# Patient Record
Sex: Female | Born: 1950 | Race: White | Hispanic: No | Smoking: Never smoker
Health system: Southern US, Community
[De-identification: ages and names within clinical notes are randomized; demographics above are authoritative.]

## PROBLEM LIST (undated history)

## (undated) DIAGNOSIS — I10 Essential (primary) hypertension: Secondary | ICD-10-CM

## (undated) DIAGNOSIS — Z86010 Personal history of colonic polyps: Secondary | ICD-10-CM

## (undated) DIAGNOSIS — J329 Chronic sinusitis, unspecified: Secondary | ICD-10-CM

## (undated) DIAGNOSIS — M549 Dorsalgia, unspecified: Secondary | ICD-10-CM

## (undated) DIAGNOSIS — G4730 Sleep apnea, unspecified: Secondary | ICD-10-CM

## (undated) HISTORY — PX: DILATION AND CURETTAGE OF UTERUS: SHX78

## (undated) HISTORY — PX: KNEE ARTHROSCOPY: SUR90

## (undated) HISTORY — PX: ABDOMINAL HYSTERECTOMY: SHX81

## (undated) HISTORY — PX: TONSILLECTOMY: SUR1361

## (undated) HISTORY — PX: ROTATOR CUFF REPAIR: SHX139

## (undated) HISTORY — PX: COLONOSCOPY: SHX174

---
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
## 2006-06-24 DIAGNOSIS — Z860101 Personal history of adenomatous and serrated colon polyps: Secondary | ICD-10-CM | POA: Insufficient documentation

## 2006-06-24 DIAGNOSIS — Z86010 Personal history of colonic polyps: Secondary | ICD-10-CM

## 2006-06-24 HISTORY — DX: Personal history of adenomatous and serrated colon polyps: Z86.0101

## 2006-06-24 HISTORY — PX: BREAST LUMPECTOMY: SHX2

## 2006-06-24 HISTORY — DX: Personal history of colonic polyps: Z86.010

## 2006-07-03 ENCOUNTER — Ambulatory Visit (HOSPITAL_COMMUNITY): Admission: RE | Admit: 2006-07-03 | Discharge: 2006-07-03 | Payer: Self-pay | Admitting: Gastroenterology

## 2006-07-03 ENCOUNTER — Encounter (INDEPENDENT_AMBULATORY_CARE_PROVIDER_SITE_OTHER): Payer: Self-pay | Admitting: Specialist

## 2006-07-24 ENCOUNTER — Emergency Department (HOSPITAL_COMMUNITY): Admission: EM | Admit: 2006-07-24 | Discharge: 2006-07-24 | Payer: Self-pay | Admitting: Emergency Medicine

## 2006-10-02 ENCOUNTER — Inpatient Hospital Stay (HOSPITAL_COMMUNITY): Admission: EM | Admit: 2006-10-02 | Discharge: 2006-10-03 | Payer: Self-pay | Admitting: Gastroenterology

## 2006-10-02 ENCOUNTER — Ambulatory Visit: Payer: Self-pay | Admitting: Internal Medicine

## 2006-10-02 ENCOUNTER — Encounter (INDEPENDENT_AMBULATORY_CARE_PROVIDER_SITE_OTHER): Payer: Self-pay | Admitting: Specialist

## 2006-10-03 ENCOUNTER — Encounter: Payer: Self-pay | Admitting: Internal Medicine

## 2006-10-05 ENCOUNTER — Emergency Department (HOSPITAL_COMMUNITY): Admission: EM | Admit: 2006-10-05 | Discharge: 2006-10-05 | Payer: Self-pay | Admitting: Family Medicine

## 2006-11-19 ENCOUNTER — Encounter: Admission: RE | Admit: 2006-11-19 | Discharge: 2006-11-19 | Payer: Self-pay | Admitting: Family Medicine

## 2006-11-26 ENCOUNTER — Encounter: Admission: RE | Admit: 2006-11-26 | Discharge: 2006-11-26 | Payer: Self-pay | Admitting: Family Medicine

## 2006-12-10 ENCOUNTER — Encounter: Admission: RE | Admit: 2006-12-10 | Discharge: 2006-12-10 | Payer: Self-pay | Admitting: Family Medicine

## 2006-12-10 ENCOUNTER — Encounter (INDEPENDENT_AMBULATORY_CARE_PROVIDER_SITE_OTHER): Payer: Self-pay | Admitting: Diagnostic Radiology

## 2006-12-25 ENCOUNTER — Encounter: Admission: RE | Admit: 2006-12-25 | Discharge: 2006-12-25 | Payer: Self-pay | Admitting: Family Medicine

## 2007-01-13 ENCOUNTER — Ambulatory Visit (HOSPITAL_COMMUNITY): Admission: RE | Admit: 2007-01-13 | Discharge: 2007-01-13 | Payer: Self-pay | Admitting: General Surgery

## 2007-01-13 ENCOUNTER — Encounter: Admission: RE | Admit: 2007-01-13 | Discharge: 2007-01-13 | Payer: Self-pay | Admitting: General Surgery

## 2007-01-13 ENCOUNTER — Encounter (INDEPENDENT_AMBULATORY_CARE_PROVIDER_SITE_OTHER): Payer: Self-pay | Admitting: General Surgery

## 2007-02-09 ENCOUNTER — Ambulatory Visit: Payer: Self-pay | Admitting: Oncology

## 2007-02-17 ENCOUNTER — Ambulatory Visit: Admission: RE | Admit: 2007-02-17 | Discharge: 2007-05-11 | Payer: Self-pay | Admitting: Radiation Oncology

## 2007-03-12 LAB — CBC WITH DIFFERENTIAL/PLATELET
BASO%: 0.7 % (ref 0.0–2.0)
Basophils Absolute: 0 10*3/uL (ref 0.0–0.1)
EOS%: 1.1 % (ref 0.0–7.0)
Eosinophils Absolute: 0 10*3/uL (ref 0.0–0.5)
HCT: 36.7 % (ref 34.8–46.6)
HGB: 12.5 g/dL (ref 11.6–15.9)
LYMPH%: 47.7 % (ref 14.0–48.0)
MCH: 27.6 pg (ref 26.0–34.0)
MCHC: 34.1 g/dL (ref 32.0–36.0)
MCV: 81 fL (ref 81.0–101.0)
MONO#: 0.4 10*3/uL (ref 0.1–0.9)
MONO%: 8.4 % (ref 0.0–13.0)
NEUT#: 1.8 10*3/uL (ref 1.5–6.5)
NEUT%: 42.1 % (ref 39.6–76.8)
Platelets: 175 10*3/uL (ref 145–400)
RBC: 4.53 10*6/uL (ref 3.70–5.32)
RDW: 13 % (ref 11.3–14.5)
WBC: 4.2 10*3/uL (ref 3.9–10.0)
lymph#: 2 10*3/uL (ref 0.9–3.3)

## 2007-03-12 LAB — COMPREHENSIVE METABOLIC PANEL
ALT: 34 U/L (ref 0–35)
AST: 25 U/L (ref 0–37)
Albumin: 4.5 g/dL (ref 3.5–5.2)
Alkaline Phosphatase: 82 U/L (ref 39–117)
BUN: 14 mg/dL (ref 6–23)
CO2: 26 mEq/L (ref 19–32)
Calcium: 9 mg/dL (ref 8.4–10.5)
Chloride: 106 mEq/L (ref 96–112)
Creatinine, Ser: 0.77 mg/dL (ref 0.40–1.20)
Glucose, Bld: 101 mg/dL — ABNORMAL HIGH (ref 70–99)
Potassium: 3.9 mEq/L (ref 3.5–5.3)
Sodium: 141 mEq/L (ref 135–145)
Total Bilirubin: 0.3 mg/dL (ref 0.3–1.2)
Total Protein: 6.9 g/dL (ref 6.0–8.3)

## 2007-04-03 ENCOUNTER — Ambulatory Visit: Payer: Self-pay | Admitting: Oncology

## 2007-05-20 ENCOUNTER — Ambulatory Visit: Admission: RE | Admit: 2007-05-20 | Discharge: 2007-06-04 | Payer: Self-pay | Admitting: Radiation Oncology

## 2007-07-06 ENCOUNTER — Ambulatory Visit: Payer: Self-pay | Admitting: Oncology

## 2007-07-08 LAB — CBC WITH DIFFERENTIAL/PLATELET
BASO%: 0.5 % (ref 0.0–2.0)
Basophils Absolute: 0 10*3/uL (ref 0.0–0.1)
EOS%: 0.9 % (ref 0.0–7.0)
Eosinophils Absolute: 0 10*3/uL (ref 0.0–0.5)
HCT: 35.9 % (ref 34.8–46.6)
HGB: 12.3 g/dL (ref 11.6–15.9)
LYMPH%: 38.9 % (ref 14.0–48.0)
MCH: 27.4 pg (ref 26.0–34.0)
MCHC: 34.2 g/dL (ref 32.0–36.0)
MCV: 80.2 fL — ABNORMAL LOW (ref 81.0–101.0)
MONO#: 0.4 10*3/uL (ref 0.1–0.9)
MONO%: 11.6 % (ref 0.0–13.0)
NEUT#: 1.7 10*3/uL (ref 1.5–6.5)
NEUT%: 48.1 % (ref 39.6–76.8)
Platelets: 175 10*3/uL (ref 145–400)
RBC: 4.47 10*6/uL (ref 3.70–5.32)
RDW: 13.9 % (ref 11.3–14.5)
WBC: 3.5 10*3/uL — ABNORMAL LOW (ref 3.9–10.0)
lymph#: 1.3 10*3/uL (ref 0.9–3.3)

## 2007-07-08 LAB — COMPREHENSIVE METABOLIC PANEL WITH GFR
ALT: 21 U/L (ref 0–35)
AST: 22 U/L (ref 0–37)
Albumin: 4.1 g/dL (ref 3.5–5.2)
Alkaline Phosphatase: 63 U/L (ref 39–117)
BUN: 12 mg/dL (ref 6–23)
CO2: 27 meq/L (ref 19–32)
Calcium: 8.7 mg/dL (ref 8.4–10.5)
Chloride: 108 meq/L (ref 96–112)
Creatinine, Ser: 0.72 mg/dL (ref 0.40–1.20)
Glucose, Bld: 113 mg/dL — ABNORMAL HIGH (ref 70–99)
Potassium: 4.1 meq/L (ref 3.5–5.3)
Sodium: 144 meq/L (ref 135–145)
Total Bilirubin: 0.3 mg/dL (ref 0.3–1.2)
Total Protein: 6.5 g/dL (ref 6.0–8.3)

## 2007-07-21 ENCOUNTER — Emergency Department (HOSPITAL_COMMUNITY): Admission: EM | Admit: 2007-07-21 | Discharge: 2007-07-21 | Payer: Self-pay | Admitting: Emergency Medicine

## 2007-08-12 ENCOUNTER — Emergency Department (HOSPITAL_COMMUNITY): Admission: EM | Admit: 2007-08-12 | Discharge: 2007-08-12 | Payer: Self-pay | Admitting: Family Medicine

## 2007-08-13 ENCOUNTER — Emergency Department (HOSPITAL_COMMUNITY): Admission: EM | Admit: 2007-08-13 | Discharge: 2007-08-13 | Payer: Self-pay | Admitting: Family Medicine

## 2007-08-15 ENCOUNTER — Emergency Department (HOSPITAL_COMMUNITY): Admission: EM | Admit: 2007-08-15 | Discharge: 2007-08-15 | Payer: Self-pay | Admitting: Family Medicine

## 2007-08-19 ENCOUNTER — Ambulatory Visit (HOSPITAL_BASED_OUTPATIENT_CLINIC_OR_DEPARTMENT_OTHER): Admission: RE | Admit: 2007-08-19 | Discharge: 2007-08-19 | Payer: Self-pay | Admitting: Orthopedic Surgery

## 2007-11-20 ENCOUNTER — Encounter: Admission: RE | Admit: 2007-11-20 | Discharge: 2007-11-20 | Payer: Self-pay | Admitting: Family Medicine

## 2008-04-05 ENCOUNTER — Ambulatory Visit: Payer: Self-pay | Admitting: Oncology

## 2008-04-06 ENCOUNTER — Emergency Department (HOSPITAL_COMMUNITY): Admission: EM | Admit: 2008-04-06 | Discharge: 2008-04-06 | Payer: Self-pay | Admitting: Family Medicine

## 2008-04-07 LAB — COMPREHENSIVE METABOLIC PANEL
ALT: 26 U/L (ref 0–35)
AST: 33 U/L (ref 0–37)
Albumin: 3.8 g/dL (ref 3.5–5.2)
Alkaline Phosphatase: 57 U/L (ref 39–117)
BUN: 12 mg/dL (ref 6–23)
CO2: 26 mEq/L (ref 19–32)
Calcium: 8.6 mg/dL (ref 8.4–10.5)
Chloride: 103 mEq/L (ref 96–112)
Creatinine, Ser: 0.79 mg/dL (ref 0.40–1.20)
Glucose, Bld: 175 mg/dL — ABNORMAL HIGH (ref 70–99)
Potassium: 3.6 mEq/L (ref 3.5–5.3)
Sodium: 137 mEq/L (ref 135–145)
Total Bilirubin: 0.6 mg/dL (ref 0.3–1.2)
Total Protein: 6.4 g/dL (ref 6.0–8.3)

## 2008-04-07 LAB — CBC WITH DIFFERENTIAL/PLATELET
BASO%: 0.2 % (ref 0.0–2.0)
Basophils Absolute: 0 10*3/uL (ref 0.0–0.1)
EOS%: 0 % (ref 0.0–7.0)
Eosinophils Absolute: 0 10*3/uL (ref 0.0–0.5)
HCT: 36.2 % (ref 34.8–46.6)
HGB: 12.3 g/dL (ref 11.6–15.9)
LYMPH%: 31.2 % (ref 14.0–48.0)
MCH: 28 pg (ref 26.0–34.0)
MCHC: 33.9 g/dL (ref 32.0–36.0)
MCV: 82.7 fL (ref 81.0–101.0)
MONO#: 0.6 10*3/uL (ref 0.1–0.9)
MONO%: 14.3 % — ABNORMAL HIGH (ref 0.0–13.0)
NEUT#: 2.1 10*3/uL (ref 1.5–6.5)
NEUT%: 54.3 % (ref 39.6–76.8)
Platelets: 158 10*3/uL (ref 145–400)
RBC: 4.37 10*6/uL (ref 3.70–5.32)
RDW: 13.4 % (ref 11.3–14.5)
WBC: 3.9 10*3/uL (ref 3.9–10.0)
lymph#: 1.2 10*3/uL (ref 0.9–3.3)

## 2008-05-22 ENCOUNTER — Emergency Department (HOSPITAL_COMMUNITY): Admission: EM | Admit: 2008-05-22 | Discharge: 2008-05-22 | Payer: Self-pay | Admitting: Family Medicine

## 2008-07-22 ENCOUNTER — Emergency Department (HOSPITAL_COMMUNITY): Admission: EM | Admit: 2008-07-22 | Discharge: 2008-07-22 | Payer: Self-pay | Admitting: Emergency Medicine

## 2008-07-29 ENCOUNTER — Emergency Department (HOSPITAL_COMMUNITY): Admission: EM | Admit: 2008-07-29 | Discharge: 2008-07-29 | Payer: Self-pay | Admitting: Emergency Medicine

## 2008-08-14 ENCOUNTER — Ambulatory Visit (HOSPITAL_COMMUNITY): Admission: RE | Admit: 2008-08-14 | Discharge: 2008-08-14 | Payer: Self-pay | Admitting: Orthopedic Surgery

## 2008-08-24 ENCOUNTER — Ambulatory Visit: Payer: Self-pay | Admitting: Oncology

## 2008-08-26 LAB — RESEARCH LABS

## 2008-09-06 ENCOUNTER — Ambulatory Visit (HOSPITAL_COMMUNITY): Admission: RE | Admit: 2008-09-06 | Discharge: 2008-09-08 | Payer: Self-pay | Admitting: Orthopedic Surgery

## 2008-11-11 ENCOUNTER — Encounter: Admission: RE | Admit: 2008-11-11 | Discharge: 2008-11-11 | Payer: Self-pay | Admitting: Specialist

## 2008-11-22 ENCOUNTER — Encounter: Admission: RE | Admit: 2008-11-22 | Discharge: 2008-11-22 | Payer: Self-pay | Admitting: Oncology

## 2009-03-31 ENCOUNTER — Ambulatory Visit: Payer: Self-pay | Admitting: Oncology

## 2009-06-14 ENCOUNTER — Observation Stay (HOSPITAL_COMMUNITY): Admission: EM | Admit: 2009-06-14 | Discharge: 2009-06-15 | Payer: Self-pay | Admitting: Emergency Medicine

## 2009-11-23 ENCOUNTER — Encounter: Admission: RE | Admit: 2009-11-23 | Discharge: 2009-11-23 | Payer: Self-pay | Admitting: Family Medicine

## 2010-09-24 LAB — COMPREHENSIVE METABOLIC PANEL
ALT: 24 U/L (ref 0–35)
AST: 24 U/L (ref 0–37)
Albumin: 3.9 g/dL (ref 3.5–5.2)
Alkaline Phosphatase: 69 U/L (ref 39–117)
BUN: 10 mg/dL (ref 6–23)
CO2: 25 mEq/L (ref 19–32)
Calcium: 9 mg/dL (ref 8.4–10.5)
Chloride: 108 mEq/L (ref 96–112)
Creatinine, Ser: 0.69 mg/dL (ref 0.4–1.2)
GFR calc Af Amer: >60 mL/min (ref >60)
GFR calc non Af Amer: >60 mL/min (ref >60)
Glucose, Bld: 106 mg/dL — ABNORMAL HIGH (ref 70–99)
Potassium: 3.7 mEq/L (ref 3.5–5.1)
Sodium: 140 mEq/L (ref 135–145)
Total Bilirubin: 0.1 mg/dL — ABNORMAL LOW (ref 0.3–1.2)
Total Protein: 6.5 g/dL (ref 6.0–8.3)

## 2010-09-24 LAB — CBC
HCT: 35.3 % — ABNORMAL LOW (ref 36.0–46.0)
HCT: 36.5 % (ref 36.0–46.0)
Hemoglobin: 11.9 g/dL — ABNORMAL LOW (ref 12.0–15.0)
Hemoglobin: 12.4 g/dL (ref 12.0–15.0)
MCHC: 33.6 g/dL (ref 30.0–36.0)
MCHC: 34.1 g/dL (ref 30.0–36.0)
MCV: 84 fL (ref 78.0–100.0)
MCV: 84.9 fL (ref 78.0–100.0)
Platelets: 149 10*3/uL — ABNORMAL LOW (ref 150–400)
Platelets: 177 10*3/uL (ref 150–400)
RBC: 4.16 MIL/uL (ref 3.87–5.11)
RBC: 4.35 MIL/uL (ref 3.87–5.11)
RDW: 13 % (ref 11.5–15.5)
RDW: 13.3 % (ref 11.5–15.5)
WBC: 5.8 10*3/uL (ref 4.0–10.5)
WBC: 6.6 10*3/uL (ref 4.0–10.5)

## 2010-09-24 LAB — BASIC METABOLIC PANEL
BUN: 12 mg/dL (ref 6–23)
CO2: 26 mEq/L (ref 19–32)
Calcium: 8.7 mg/dL (ref 8.4–10.5)
Chloride: 109 mEq/L (ref 96–112)
Creatinine, Ser: 0.57 mg/dL (ref 0.4–1.2)
GFR calc Af Amer: >60 mL/min (ref >60)
GFR calc non Af Amer: >60 mL/min (ref >60)
Glucose, Bld: 105 mg/dL — ABNORMAL HIGH (ref 70–99)
Potassium: 3.5 mEq/L (ref 3.5–5.1)
Sodium: 142 mEq/L (ref 135–145)

## 2010-09-24 LAB — HEMOGLOBIN A1C
Hgb A1c MFr Bld: 6.7 % — ABNORMAL HIGH (ref 4.6–6.1)
Mean Plasma Glucose: 146 mg/dL

## 2010-09-24 LAB — POCT CARDIAC MARKERS
CKMB, poc: <1 ng/mL — ABNORMAL LOW (ref 1.0–8.0)
Myoglobin, poc: 55.7 ng/mL (ref 12–200)
Troponin i, poc: 0.07 ng/mL (ref 0.00–0.09)

## 2010-09-24 LAB — CK TOTAL AND CKMB (NOT AT ARMC)
CK, MB: 2.2 ng/mL (ref 0.3–4.0)
Relative Index: 2.2 (ref 0.0–2.5)
Total CK: 102 U/L (ref 7–177)

## 2010-09-24 LAB — DIFFERENTIAL
Basophils Absolute: 0 10*3/uL (ref 0.0–0.1)
Basophils Relative: 1 % (ref 0–1)
Eosinophils Absolute: 0 10*3/uL (ref 0.0–0.7)
Eosinophils Relative: 0 % (ref 0–5)
Lymphocytes Relative: 38 % (ref 12–46)
Lymphs Abs: 2.5 10*3/uL (ref 0.7–4.0)
Monocytes Absolute: 0.6 10*3/uL (ref 0.1–1.0)
Monocytes Relative: 8 % (ref 3–12)
Neutro Abs: 3.5 10*3/uL (ref 1.7–7.7)
Neutrophils Relative %: 53 % (ref 43–77)

## 2010-09-24 LAB — CARDIAC PANEL(CRET KIN+CKTOT+MB+TROPI)
CK, MB: 1.5 ng/mL (ref 0.3–4.0)
Relative Index: INVALID (ref 0.0–2.5)
Total CK: 85 U/L (ref 7–177)
Troponin I: 0.02 ng/mL (ref 0.00–0.06)

## 2010-09-24 LAB — LIPID PANEL
Cholesterol: 158 mg/dL (ref 0–200)
HDL: 38 mg/dL — ABNORMAL LOW (ref >39)
LDL Cholesterol: 79 mg/dL (ref 0–99)
Total CHOL/HDL Ratio: 4.2 RATIO
Triglycerides: 206 mg/dL — ABNORMAL HIGH (ref <150)
VLDL: 41 mg/dL — ABNORMAL HIGH (ref 0–40)

## 2010-09-24 LAB — TROPONIN I: Troponin I: 0.03 ng/mL (ref 0.00–0.06)

## 2010-10-04 LAB — BASIC METABOLIC PANEL
BUN: 15 mg/dL (ref 6–23)
CO2: 29 mEq/L (ref 19–32)
Calcium: 9 mg/dL (ref 8.4–10.5)
Chloride: 108 mEq/L (ref 96–112)
Creatinine, Ser: 0.74 mg/dL (ref 0.4–1.2)
GFR calc Af Amer: >60 mL/min (ref >60)
GFR calc non Af Amer: >60 mL/min (ref >60)
Glucose, Bld: 114 mg/dL — ABNORMAL HIGH (ref 70–99)
Potassium: 4.6 mEq/L (ref 3.5–5.1)
Sodium: 143 mEq/L (ref 135–145)

## 2010-10-04 LAB — CBC
HCT: 36.3 % (ref 36.0–46.0)
Hemoglobin: 12.3 g/dL (ref 12.0–15.0)
MCHC: 33.9 g/dL (ref 30.0–36.0)
MCV: 84.1 fL (ref 78.0–100.0)
Platelets: 187 10*3/uL (ref 150–400)
RBC: 4.32 MIL/uL (ref 3.87–5.11)
RDW: 13.4 % (ref 11.5–15.5)
WBC: 5.8 10*3/uL (ref 4.0–10.5)

## 2010-11-06 NOTE — Op Note (Signed)
[name], [name]                 ACCOUNT NO.:  [id number]   MEDICAL RECORD NO.:  [id number]          PATIENT TYPE:  OIB   LOCATION:  5022                         FACILITY:  [facility]   PHYSICIAN:  Dionne Ano. Gramig, M.D.DATE OF BIRTH:  [birth year]   DATE OF PROCEDURE:  DATE OF DISCHARGE:                               OPERATIVE REPORT   PREOPERATIVE DIAGNOSIS:  Left shoulder rotator cuff tear with  impingement symptoms.   POSTOPERATIVE DIAGNOSIS:  Left shoulder rotator cuff tear with  impingement symptoms.   PROCEDURES:  1. Arthroscopy, left shoulder with subacromial decompression and      bursectomy.  Evaluation of the clavicle revealed adequate confines      without excessive impingement from the clavicular region.  2. Mini-open rotator cuff repair.  3. Glenohumeral arthroscopy with debridement and notable mild      glenohumeral degenerative joint disease.  4. Evaluation under anesthesia, left shoulder.   SURGEON:  Dionne Ano. Amanda Pea, MD   ASSISTANT:  Karie Chimera, PA-C   COMPLICATIONS:  None.   INDICATIONS FOR PROCEDURE:  The patient is a 60 year old female who  presents for the above-mentioned diagnosis.  I have counseled her in  regards to risks and benefits of surgery including risk of infection,  bleeding, anesthesia, damage to normal structures, and failure of  surgery to accomplish its intended goals of relieving symptoms and  restoring function.  With these in mind, she decided to proceed.  All  questions have been encouraged and answered preoperatively.   OPERATIVE PROCEDURE:  The patient was seen by myself and Anesthesia,  taken to the operating suite, and underwent a smooth induction of  general anesthesia.  Time-out was called.  Preoperative interscalene  block was placed by Dr. Adonis Huguenin and she was then placed in beach-  chair position well padded.  Following securing sterile field with  sterile prep and drape, evaluation under anesthesia revealed no  evidence  of excessive crepitans or instability about the shoulder, although she  had a very large shoulder with moderate amount of adipose tissue.  Operation commenced with posterolateral portal placement after all  landmarks were made and sterile field secured.  I then performed  systematic arthroscopy.  The glenohumeral joint had some mild  degenerative disease in the glenoid.  The biceps and ankle was intact.  She had a significant rotator cuff tear about the supraspinatus  involving portions of the infraspinatus as well.  She had significant  fraying and degenerative-type changes within the cuff.  Following this,  the patient had attention turned towards the subacromial space where she  underwent subacromial decompression and bursectomy.  This was done  without difficulty.  She tolerated this well.  Once this was done, the  distal clavicle was noted to be in adequate repair and without signs of  excessive impingement against the cuff.  Thus the patient underwent  arthroscopy with noted glenohumeral degenerative disease and significant  rotator cuff.  Subacromial decompression and bursectomy was performed of  course.  I had chosen not to perform an aggressive distal clavicle  resection as this was in  fairly good shape in my opinion.   Following this, I then performed a mini-open incision 1 to 1-1/2 inches.  Dissection was carried down, anterior and middle deltoid raphe were  split and were not detached from the acromion.  I then went down to the  rotator cuff, identified the cuff, and performed immobilization  technique, pulled the rotator cuff so that we can better outline the  rotator cuff.  Additional bursectomy was accomplished as necessary and  following this, I prepared a trough for the rotator cuff, debrided the  ends, and following this placed a 5 x 5 Arthrex suture anchor followed  by placing 2 sutures midline of the FiberWire to allow for  immobilization followed by  placement of 4 exiting FiberWire sutures from  the suture anchoring to the cuff.  Following this, the area was  irrigated, the cuff was brought down via the suture anchors and once  this was completed, all six ilets placed in PushLock anchor, which was  then used to perform a double-vested repair to secure the cuff with a  two-tiered approach.  Thus, double fixation was accomplished.   Following this, the area was irrigated, deltoid raphe was closed, area  was irrigated.  Subcu was closed with Vicryl, skin edge with Prolene.  She was taken to recovery room in stable condition in shoulder  immobilizer.   She tolerated the procedure well.  We will plan for passive range of  motion weeks 0-4, active assisted weeks 4-8, and active range of motion  at 10 weeks postop.  This was a fairly significant tear.  She tolerated  the procedure well, had a good fixation.  She will be monitored  overnight and we will plan for all general postop measures in terms of  precautions, etc.   It has been a pleasure to participate in her care.  All questions have  been encouraged and answered.      Dionne Ano. Amanda Pea, M.D.  Electronically Signed     WMG/MEDQ  D:  09/06/2008  T:  09/07/2008  Job:  [id number]

## 2010-11-06 NOTE — Op Note (Signed)
[name], OHLINGER                 ACCOUNT NO.:  [id number]   MEDICAL RECORD NO.:  [id number]          PATIENT TYPE:  AMB   LOCATION:  DSC                          FACILITY:  [facility]   PHYSICIAN:  Matthew A. Weingold, M.D.DATE OF BIRTH:  [birth year]   DATE OF PROCEDURE:  08/19/2007  DATE OF DISCHARGE:                               OPERATIVE REPORT   PREOPERATIVE DIAGNOSIS:  Left long and left thumb volar lacerations.   POSTOPERATIVE DIAGNOSIS:  Left long and left thumb volar lacerations.   PROCEDURE:  Exploration and repair of left long finger radial digital  artery and nerve microscopically, and left thumb ulnar digital nerve  microscopically.   SURGEON:  Artist Pais. Mina Marble, MD.   ASSISTANT:  None.   ANESTHESIA:  General.   TOURNIQUET TIME:  51 minutes.   No complications, no drains.   OPERATIVE REPORT:  The patient was taken to the operating suite.  After  the induction of adequate general anesthesia, the left upper extremity  was prepped and draped in the usual sterile fashion.  An Esmarch was  used to exsanguinate the limb.  The tourniquet was then inflated to 250  mmHg.  At this point in time, the volar lacerations on the long and  thumb were approached surgically and the sutures were removed.  The long  finger laceration was extended proximally and distally, flaps were  raised accordingly, dissection was carried down to the radial and  intervascular bundle with complete laceration of the __________ nerve.  Under microscopic magnification, the nerve and artery ends were prepped  and repaired microscopically using #9-0 nylon.  Four sutures were used  for the nerve, and three for the artery.  Attention was then paid to the  thumb where the ulnar digital nerve had a 50% laceration.  Scar tissue  was removed from the end of the partially lacerated nerve, and this was  repaired with two #9-0 nylon sutures microscopically.  The wounds were  then irrigated and the incisions  closed with #4-0 Vicryl repeat suture,  and Xeroform, 4x4s, and then a volar splint was applied with flexion to  the fingers and thumb.  The patient tolerated the procedures well.      Artist Pais Mina Marble, M.D.  Electronically Signed     MAW/MEDQ  D:  08/20/2007  T:  08/20/2007  Job:  [id number]

## 2010-11-06 NOTE — Op Note (Signed)
[name], [name]                 ACCOUNT NO.:  [id number]   MEDICAL RECORD NO.:  [id number]          PATIENT TYPE:  AMB   LOCATION:  SDS                          FACILITY:  [facility]   PHYSICIAN:  Ollen Gross. Vernell Morgans, M.D. DATE OF BIRTH:  1950/08/02   DATE OF PROCEDURE:  01/13/2007  DATE OF DISCHARGE:  01/13/2007                               OPERATIVE REPORT   PREOPERATIVE DIAGNOSIS:  Right breast ductal carcinoma in situ.   POSTOPERATIVE DIAGNOSIS:  Right breast ductal carcinoma in situ.   PROCEDURE:  Right needle-localized lumpectomy.   SURGEON:  Ollen Gross. Vernell Morgans, M.D.   ANESTHESIA:  General.   PROCEDURE:  After informed consent was obtained, the patient was brought  to the operating room and placed in a supine position on the operating  room table.  After adequate induction of general anesthesia, the  patient's right breast was prepped with Betadine and draped in the usual  sterile manner.  Earlier in the day the patient had undergone a wire  localization procedure, and the wire were was entering the breast  inferiorly and the mass was central in the breast.  A radial inferior  incision was made with a 15 blade knife overlying the path of the wire.  This incision was carried down through the skin and into the  subcutaneous tissue and fatty breast tissue sharply with the  electrocautery.  A circular portion of breast tissue was excised around  the path of the wire.  This was done sharply with the electrocautery.  As we dissected out the superior margin, we did encounter the hook of  the wire.  Once the main specimen was removed from the patient sharply  with the electrocautery, we were concerned that we may be too close to  the actual DCIS area given the fact that we came across the hook of the  wire.  We therefore took an additional superior margin to include the  area where the hook was in the tissue.  The specimen itself, once it was  removed, was marked with a short stitch  superior and a long stitch  lateral.  The additional superior margin was marked with a double stitch  on the true surgical margin and a single stitch on the anterior portion  of this additional margin.  Hemostasis was then achieved using the Bovie  electrocautery.  The wound was irrigated with copious amounts of saline.  The deeper layer of the wound was then closed with interrupted 3-0  Vicryl stitches and skin was closed with a running 4-0 Monocryl  subcuticular stitch.  Radiology called and said that the calcifications  in question were in the main specimen.  The area around the wound was  then infiltrated with 0.25% Marcaine.  Benzoin, Steri-Strips and sterile  dressings were applied.  The patient tolerated the procedure well.  At  the end of the case all needle, sponge and instrument counts were  correct.  The patient was awakened and taken to recovery in stable  condition.      Ollen Gross. Vernell Morgans, M.D.  Electronically  Signed     PST/MEDQ  D:  01/13/2007  T:  01/14/2007  Job:  [id number]

## 2010-11-09 NOTE — H&P (Signed)
[name], [name]                 ACCOUNT NO.:  [id number]   MEDICAL RECORD NO.:  [id number]          PATIENT TYPE:  INP   LOCATION:  1236                         FACILITY:  [facility]   PHYSICIAN:  Kela Millin, M.D.DATE OF BIRTH:  [birth year]   DATE OF ADMISSION:  10/02/2006  DATE OF DISCHARGE:                              HISTORY & PHYSICAL   CONTINUATION:   HISTORY OF PRESENT ILLNESS:  The patient denies chest pain, shortness of  breath, cough, fevers, dysuria, melena, diarrhea and no hematochezia.   PAST MEDICAL HISTORY:  1. Sleep apnea.  2. GERD.  3. Eczema.  4. Arthritis.  5. Spinal stenosis.   MEDICATIONS:  1. Zyrtec 10 mg daily.  2. Nasonex two sprays daily.  3. Aleve p.r.n.  4. AcipHex 20 mg daily.   ALLERGIES:  CODEINE, ERYTHROMYCIN and MORPHINE.   SOCIAL HISTORY:  She quit tobacco 21 years ago, occasional alcohol.   FAMILY HISTORY:  Her mother had a heart attack in the sixties.  Other  family history positive for hypertension and diabetes.   REVIEW OF SYSTEMS:  As per HPI.  Other review of systems negative.   PHYSICAL EXAM:  IN GENERAL:  The patient is a pleasant, middle-aged,  white female.  She is alert and oriented, in no apparent distress.  VITAL SIGNS:  Her temperature is 98.7, blood pressure 132/83, initially  152/90, pulse ranging 122-146, O2 sat 98% on room air.  HEENT:  She has a mild erythema/bruise on the left cheek and nose area.  PERRL, EOMI, moist mucous membranes, sclerae anicteric.  NECK:  Supple, no adenopathy, no thyromegaly, no JVD.  LUNGS:  Clear to auscultation bilaterally.  No crackles or wheezes.  CARDIOVASCULAR:  Irregularly irregular, normal S1, S2, tachycardic.  ABDOMEN:  Soft, bowel sounds present, nontender, nondistended.  No  organomegaly and no masses palpable.  EXTREMITIES:  No cyanosis and no edema.  NEUROLOGIC:  Alert and oriented times three.  Cranial nerves II through  XII grossly intact.  Nonfocal exam.   LABORATORY  DATA:  On CT scan of head:  No acute intracranial findings,  mild left sinus disease.   Chest x-ray:  Cannot rule out pneumomediastinum.  Otherwise, within  normal limits.   The CBC and CMET, as well as cardiac enzymes, are pending.   ASSESSMENT AND PLAN:  1. New-onset atrial fibrillation with rapid ventricular rate:  We will      continue Cardizem drip, check a TSH level, obtain a 2D echo in the      a.m.  We will also place the patient on Lovenox for now, obtain      serial cardiac enzymes and follow.  2. Syncope:  Secondary to #1, CT scan of head negative.  3. ? Pneumomediastinum:  Follow and recheck chest x-ray.  4. Gastroesophageal reflux disease:  Continue PPI.  5. History of seasonal allergies:  Continue Zyrtec.      Kela Millin, M.D.  Electronically Signed     ACV/MEDQ  D:  10/03/2006  T:  10/03/2006  Job:  [id number]   cc:   Molly Maduro  Dion Body, M.D.  Fax: [phone]   [facility]

## 2010-11-09 NOTE — Op Note (Signed)
[name], [name]                 ACCOUNT NO.:  [id number]   MEDICAL RECORD NO.:  [id number]          PATIENT TYPE:  AMB   LOCATION:  ENDO                         [facility]AC[facility]   PHYSICIAN:  Shirley Friar, MDDATE OF BIRTH:  Mar 14, 1951   DATE OF PROCEDURE:  10/02/2006  DATE OF DISCHARGE:                               OPERATIVE REPORT   INDICATIONS:  Colon polyp removal based on adenomatous biopsy results  from previous colonoscopy.   MEDICATIONS:  Fentanyl 100 mcg IV, Versed 2.5 mg IV, propofol 400 mg IV  infusion per anesthesia.   FINDINGS:  Rectal exam was normal.  An adult Pentax colonoscope was  inserted into an adequately prepped colon, advanced to the cecum where  the ileocecal valve and appendiceal orifice were identified.  During the  procedure the patient had a hypoxic episode that was thought to be  secondary to laryngeal spasm that resolved with positive pressure  ventilation for a short period of time.  On careful withdrawal of the  colonoscope, a nodular fold was noted in the ascending colon that was  biopsied x2.  On further withdrawal of the colonoscope, the previously  noted 1.5 cm polyp was seen in the transverse colon that was sessile.  This polyp was removed in piecemeal fashion with snare cautery.  No  immediate complications were seen from the polypectomy site.  A Roth Net  was used to retrieve these two parts of the colon polyp.  No residual  polyp was seen at the polypectomy site.  The remaining portion of  colonoscopy was normal.   Prior to her discharge in recovery, Ms. [name] fell and reportedly hit her  head on the floor. She was stabilized and transferred to the Emergency  Room for further evaluation and management.   ASSESSMENT:  1. Large colon polyp removed in piecemeal fashion with snare cautery.  2. Nodular fold in the ascending colon, status post biopsy.   PLAN:  1. Follow up on pathology.  2. No aspirin products for 14 days.      Shirley Friar, MD  Electronically Signed     VCS/MEDQ  D:  10/02/2006  T:  10/02/2006  Job:  [id number]   cc:   Bryan Lemma. Manus Gunning, M.D.  Fax: [phone]

## 2010-11-09 NOTE — Op Note (Signed)
[name], [name]                 ACCOUNT NO.:  [id number]   MEDICAL RECORD NO.:  [id number]          PATIENT TYPE:  AMB   LOCATION:  ENDO                         FACILITY:  [facility]   PHYSICIAN:  Shirley Friar, MDDATE OF BIRTH:  [birth year]   DATE OF PROCEDURE:  DATE OF DISCHARGE:                               OPERATIVE REPORT   PROCEDURE:  Colonoscopy.   INDICATIONS:  Screening, family history of colon polyps in her son,  family history of colon cancer in her maternal aunt.   MEDICATIONS:  400 mg of propofol infusion by Anesthesia, 2 mg of Versed.   FINDINGS:  Rectal exam was normal.  An adult Pentax colonoscope was  inserted into a fair prepped colon and advanced to the cecum where the  ileocecal valve and appendiceal orifice were identified.  The terminal  ileum was intubated.  In the hepatic flexure, there was a 1.5 cm sessile  lipomatous-like structure that was probed and had the pillow effect.  Initially 4 mL of normal saline was injected in and around this polyp-  like area, but upon further evaluation of this polyp-like structure it  was suspicious for a lipoma and not a true polyp.  Because of that, two  cold biopsies were then taken of this structure to get pathology  diagnosis.  After cold biopsy, a small amount of oozing of blood  continued so 2 mL of epinephrine 1:10,000 was injected at the base of  the structure with complete hemostasis.  On further withdrawal of the  colonoscope, scattered left-sided diverticulosis was seen.  Retroflexion  was unremarkable.   ASSESSMENT:  1. 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  2. Scattered left-sided diverticulosis.   PLAN:  1. Follow-up of path.  2. High-fiber diet.  3. Repeat colonoscopy in 5 years. (pending path)      Shirley Friar, MD  Electronically Signed     VCS/MEDQ  D:  07/03/2006  T:  07/03/2006  Job:  [id number]   cc:   Bryan Lemma. Manus Gunning, M.D.  Fax: [phone]

## 2010-11-09 NOTE — H&P (Signed)
[name], [name]                 ACCOUNT NO.:  [id number]   MEDICAL RECORD NO.:  [id number]          PATIENT TYPE:  INP   LOCATION:  1236                         [facility]ACILITY:  [facility]   PHYSICIAN:  Kela Millin, M.D.DATE OF BIRTH:  09-05-1950   DATE OF ADMISSION:  10/02/2006  DATE OF DISCHARGE:                              HISTORY & PHYSICAL   PRIMARY CARE PHYSICIAN:  Dr. Manus Gunning.   CHIEF COMPLAINT:  Syncope.   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 Bosie Clos at  [facility].  Per Dr. Marge Duncans note, he removed a large colon polyp,  and the procedure was otherwise uncomplicated, and per patient report,  she was in sinus rhythm on the monitor throughout the procedure.  After  the procedure, the patient got ready and was awaiting to go home, but  she felt nauseous and started heaving, and feeling faint, and she thinks  that the next thing she knew, she was on the floor.  Her mother was  there and states that she fell, hitting her face on the floor ans was  unconscious for less than a minute.  Also, her mother reports that she  was disoriented momentarily after she came to, but otherwise became  oriented and appropriate thereafter.  The patient was transported to the  emergency room, and an EKG done revealed atrial fibrillation with RVR  and a blood pressure of 152/90.  She was noted to have a bruise on her  face from the fall, and a CT scan done of her brain negative for any  acute intracranial findings.  The patient was started on a Cardizem drip  per ER physician, and she was admitted to the [facility]  for that evaluation and management.  Ms. Crossland states that she has had  some dizziness intermittently, but she has always attributed that to her  sinus infection/seasonal allergies.   Dictation Ended At  This Point      Kela Millin, M.D.  Electronically Signed     ACV/MEDQ  D:  10/03/2006  T:  10/03/2006  Job:  [id number]

## 2011-03-15 LAB — BASIC METABOLIC PANEL
BUN: 8
CO2: 29
Calcium: 8.7
Chloride: 107
Creatinine, Ser: 0.68
GFR calc Af Amer: >60
GFR calc non Af Amer: >60
Glucose, Bld: 109 — ABNORMAL HIGH
Potassium: 3.9
Sodium: 141

## 2011-03-15 LAB — POCT HEMOGLOBIN-HEMACUE: Hemoglobin: 11.6 — ABNORMAL LOW

## 2011-04-08 LAB — COMPREHENSIVE METABOLIC PANEL
ALT: 65 — ABNORMAL HIGH
AST: 47 — ABNORMAL HIGH
Albumin: 3.7
Alkaline Phosphatase: 67
BUN: 12
CO2: 27
Calcium: 9
Chloride: 108
Creatinine, Ser: 0.71
GFR calc Af Amer: >60
GFR calc non Af Amer: >60
Glucose, Bld: 100 — ABNORMAL HIGH
Potassium: 4.3
Sodium: 139
Total Bilirubin: 0.9
Total Protein: 5.8 — ABNORMAL LOW

## 2011-04-08 LAB — DIFFERENTIAL
Basophils Absolute: 0.1
Basophils Relative: 1
Eosinophils Absolute: 0.1
Eosinophils Relative: 1
Lymphocytes Relative: 49 — ABNORMAL HIGH
Lymphs Abs: 3
Monocytes Absolute: 0.6
Monocytes Relative: 10
Neutro Abs: 2.5
Neutrophils Relative %: 40 — ABNORMAL LOW

## 2011-04-08 LAB — CBC
HCT: 37.9
Hemoglobin: 12.6
MCHC: 33.3
MCV: 82
Platelets: 203
RBC: 4.62
RDW: 14.3 — ABNORMAL HIGH
WBC: 6.3

## 2011-04-11 ENCOUNTER — Other Ambulatory Visit: Payer: Self-pay | Admitting: *Deleted

## 2011-04-11 DIAGNOSIS — Z853 Personal history of malignant neoplasm of breast: Secondary | ICD-10-CM

## 2011-04-25 ENCOUNTER — Ambulatory Visit
Admission: RE | Admit: 2011-04-25 | Discharge: 2011-04-25 | Disposition: A | Discharge status: Home / Self Care | Payer: BC Managed Care – PPO | Source: Ambulatory Visit | Attending: *Deleted | Admitting: *Deleted

## 2011-04-25 DIAGNOSIS — Z853 Personal history of malignant neoplasm of breast: Secondary | ICD-10-CM

## 2011-06-30 ENCOUNTER — Encounter: Payer: Self-pay | Admitting: *Deleted

## 2011-06-30 ENCOUNTER — Emergency Department (INDEPENDENT_AMBULATORY_CARE_PROVIDER_SITE_OTHER)
Admission: EM | Admit: 2011-06-30 | Discharge: 2011-06-30 | Disposition: A | Discharge status: Home / Self Care | Payer: BC Managed Care – PPO | Source: Home / Self Care | Attending: Family Medicine | Admitting: Family Medicine

## 2011-06-30 DIAGNOSIS — J069 Acute upper respiratory infection, unspecified: Secondary | ICD-10-CM

## 2011-06-30 HISTORY — DX: Essential (primary) hypertension: I10

## 2011-06-30 HISTORY — DX: Sleep apnea, unspecified: G47.30

## 2011-06-30 MED ORDER — IPRATROPIUM BROMIDE 0.06 % NA SOLN: 2.0000 | Freq: Four times a day (QID) | NASAL | Status: AC

## 2011-06-30 MED ORDER — AZITHROMYCIN 250 MG PO TABS: ORAL_TABLET | ORAL | Status: AC

## 2011-06-30 MED ORDER — DEXTROMETHORPHAN POLISTIREX 30 MG/5ML PO LQCR: 60.0000 mg | ORAL | Status: AC | PRN

## 2011-06-30 NOTE — ED Provider Notes (Signed)
History     CSN: [id number]  Arrival date & time 06/30/11  1654   First MD Initiated Contact with Patient 06/30/11 1657      No chief complaint on file.   (Consider location/radiation/quality/duration/timing/severity/associated sxs/prior treatment) Patient is a 61 y.o. female presenting with URI. The history is provided by the patient.  URI The primary symptoms include cough. Primary symptoms do not include fever, wheezing, nausea, vomiting, myalgias or rash. The current episode started 6 to 7 days ago. This is a new problem. The problem has not changed since onset. The onset of the illness is associated with exposure to sick contacts. Symptoms associated with the illness include congestion and rhinorrhea.    No past medical history on file.  No past surgical history on file.  No family history on file.  History  Substance Use Topics  . Smoking status: Not on file  . Smokeless tobacco: Not on file  . Alcohol Use: Not on file    OB History    No data available      Review of Systems  Constitutional: Negative for fever.  HENT: Positive for congestion, rhinorrhea and postnasal drip.   Respiratory: Positive for cough. Negative for wheezing.   Gastrointestinal: Negative.  Negative for nausea, vomiting and diarrhea.  Musculoskeletal: Negative.  Negative for myalgias.  Skin: Negative for rash.    Allergies  Review of patient's allergies indicates not on file.  Home Medications   Current Outpatient Rx  Name Route Sig Dispense Refill  . AZITHROMYCIN 250 MG PO TABS  Take as directed on pack 6 each 0  . DEXTROMETHORPHAN POLISTIREX ER 30 MG/5ML PO LQCR Oral Take 10 mLs (60 mg total) by mouth as needed for cough. 89 mL 0  . IPRATROPIUM BROMIDE 0.06 % NA SOLN Nasal Place 2 sprays into the nose 4 (four) times daily. 15 mL 12    BP 142/97  Pulse 88  Temp(Src) 98.6 F (37 C) (Oral)  Resp 22  SpO2 94%  Physical Exam  Nursing note and vitals reviewed. Constitutional:  She is oriented to person, place, and time. She appears well-developed and well-nourished.  HENT:  Head: Normocephalic.  Right Ear: External ear normal.  Left Ear: External ear normal.  Nose: Mucosal edema and rhinorrhea present.  Mouth/Throat: Oropharynx is clear and moist.  Neck: Normal range of motion. Neck supple.  Cardiovascular: Normal rate, normal heart sounds and intact distal pulses.   Pulmonary/Chest: Effort normal and breath sounds normal. She has no wheezes.  Abdominal: Soft. Bowel sounds are normal.  Neurological: She is alert and oriented to person, place, and time.  Skin: Skin is warm and dry.  Psychiatric: She has a normal mood and affect. Her behavior is normal.    ED Course  Procedures (including critical care time)  Labs Reviewed - No data to display No results found.   1. URI (upper respiratory infection)       MDM          Barkley Bruns, MD 06/30/11 [phone]

## 2011-06-30 NOTE — ED Notes (Signed)
Assessed by md ready for discharge

## 2011-07-10 ENCOUNTER — Encounter (HOSPITAL_COMMUNITY): Payer: Self-pay

## 2011-07-10 ENCOUNTER — Emergency Department (INDEPENDENT_AMBULATORY_CARE_PROVIDER_SITE_OTHER)
Admission: EM | Admit: 2011-07-10 | Discharge: 2011-07-10 | Disposition: A | Discharge status: Home / Self Care | Payer: BC Managed Care – PPO | Source: Home / Self Care | Attending: Emergency Medicine | Admitting: Emergency Medicine

## 2011-07-10 DIAGNOSIS — J329 Chronic sinusitis, unspecified: Secondary | ICD-10-CM

## 2011-07-10 HISTORY — DX: Chronic sinusitis, unspecified: J32.9

## 2011-07-10 HISTORY — DX: Dorsalgia, unspecified: M54.9

## 2011-07-10 MED ORDER — AMOXICILLIN-POT CLAVULANATE ER 1000-62.5 MG PO TB12: 2.0000 | ORAL_TABLET | Freq: Two times a day (BID) | ORAL | Status: AC

## 2011-07-10 MED ORDER — PSEUDOEPHEDRINE-GUAIFENESIN ER 120-1200 MG PO TB12: 1.0000 | ORAL_TABLET | Freq: Two times a day (BID) | ORAL | Status: AC

## 2011-07-10 NOTE — Discharge Instructions (Signed)
Take the medication as written. Return if you get worse, have a fever >100.4, or for any concerns. You may take 600 mg of motrin with 1 gram of tylenol up to 4 times a day as needed for pain. This is an effective combination for pain.  Use a neti pot or the NeilMed sinus rinse as often as you want to to reduce nasal congestion. Follow the directions on the box.  ° °

## 2011-07-10 NOTE — ED Provider Notes (Signed)
History     CSN: [id number]  Arrival date & time 07/10/11  1544   First MD Initiated Contact with Patient 07/10/11 1551      Chief Complaint  Patient presents with  . Facial Pain    (Consider location/radiation/quality/duration/timing/severity/associated sxs/prior treatment) HPI Comments: Seen in [facility] 10 days ago thought to have uri. Went home with azithro, cough syrup, nasal steriod. Patient states she finished the Zithromax and is using the nasal steroid, but now complains of bilateral maxillary sinus pain, pressure worse with lying down and bending forward, dental pain, purulent nasal discharge. No nausea, vomiting, other headache, ear pain, sore throat, cough, wheeze, shortness of breath. States this feels identical to previous sinus infections.  ROS as noted in HPI. All other ROS negative.  Patient is a 61 y.o. female presenting with sinusitis. The history is provided by the patient. No language interpreter was used.  Sinusitis  This is a new problem. The current episode started more than 1 week ago. The problem has been gradually worsening. There has been no fever.    Past Medical History  Diagnosis Date  . Diabetes mellitus   . Asthma   . Hypertension   . Sleep apnea   . Sinusitis   . Back pain     Past Surgical History  Procedure Date  . Breast lumpectomy   . Abdominal hysterectomy   . Rotator cuff repair   . Knee arthroscopy   . Tonsillectomy   . Dilation and curettage of uterus     History reviewed. No pertinent family history.  History  Substance Use Topics  . Smoking status: Never Smoker   . Smokeless tobacco: Not on file  . Alcohol Use: Yes    OB History    Grav Para Term Preterm Abortions TAB SAB Ect Mult Living                  Review of Systems  Allergies  Codeine and Morphine and related  Home Medications   Current Outpatient Rx  Name Route Sig Dispense Refill  . ALBUTEROL SULFATE HFA 108 (90 BASE) MCG/ACT IN AERS Inhalation Inhale 2  puffs into the lungs every 6 (six) hours as needed.      . AMOXICILLIN-POT CLAVULANATE ER 1000-62.5 MG PO TB12 Oral Take 2 tablets by mouth 2 (two) times daily. X 7 days 28 tablet 0  . ATENOLOL 50 MG PO TABS Oral Take 50 mg by mouth daily.      Marland Kitchen DEXTROMETHORPHAN POLISTIREX ER 30 MG/5ML PO LQCR Oral Take 10 mLs (60 mg total) by mouth as needed for cough. 89 mL 0  . SIMILASE PO Oral Take by mouth 3 (three) times daily before meals.      Marland Kitchen FLUTICASONE PROPIONATE 50 MCG/ACT NA SUSP Nasal Place 2 sprays into the nose daily.      Marland Kitchen FLUTICASONE-SALMETEROL 115-21 MCG/ACT IN AERO Inhalation Inhale 2 puffs into the lungs 2 (two) times daily.      Marland Kitchen GLIPIZIDE 5 MG PO TABS Oral Take 5 mg by mouth 2 (two) times daily before a meal.      . IPRATROPIUM BROMIDE 0.06 % NA SOLN Nasal Place 2 sprays into the nose 4 (four) times daily. 15 mL 12  . LOSARTAN POTASSIUM 25 MG PO TABS Oral Take 25 mg by mouth daily.      Marland Kitchen METFORMIN HCL 1000 MG PO TABS Oral Take 1,000 mg by mouth 2 (two) times daily with a meal.      .  ALEVE PO Oral Take by mouth.      . OMEPRAZOLE 40 MG PO CPDR Oral Take 40 mg by mouth daily.      Marland Kitchen PROBIOTIC FORMULA PO Oral Take by mouth.      Marland Kitchen PSEUDOEPHEDRINE-GUAIFENESIN ER [phone] MG PO TB12 Oral Take 1 tablet by mouth 2 (two) times daily. 20 each 0    BP 144/93  Pulse 89  Temp(Src) 98.7 F (37.1 C) (Oral)  Resp 20  SpO2 95%  Physical Exam  Nursing note and vitals reviewed. Constitutional: She is oriented to person, place, and time. She appears well-developed and well-nourished.  HENT:  Head: Normocephalic and atraumatic.  Right Ear: Tympanic membrane and ear canal normal.  Left Ear: Tympanic membrane and ear canal normal.  Nose: Mucosal edema and rhinorrhea present. No epistaxis. Right sinus exhibits maxillary sinus tenderness. Right sinus exhibits no frontal sinus tenderness. Left sinus exhibits maxillary sinus tenderness. Left sinus exhibits no frontal sinus tenderness.    Mouth/Throat: Uvula is midline and mucous membranes are normal. Posterior oropharyngeal erythema present. No oropharyngeal exudate.  Eyes: Conjunctivae and EOM are normal. Pupils are equal, round, and reactive to light.  Neck: Normal range of motion. Neck supple.  Cardiovascular: Normal rate, regular rhythm and normal heart sounds.   Pulmonary/Chest: Effort normal and breath sounds normal. No respiratory distress. She has no wheezes. She has no rales.  Abdominal: She exhibits no distension. There is no tenderness. There is no rebound and no guarding.  Musculoskeletal: Normal range of motion.  Lymphadenopathy:    She has no cervical adenopathy.  Neurological: She is alert and oriented to person, place, and time.  Skin: Skin is warm and dry. No rash noted.  Psychiatric: She has a normal mood and affect. Her behavior is normal. Judgment and thought content normal.    ED Course  Procedures (including critical care time)  Labs Reviewed - No data to display No results found.   1. Sinusitis       MDM  Previous chart reviewed as noted in HPI. Patient just finished a Z-Pak we'll start high dose Augmentin for sinus infection. Will start Mucinex D., have her continue her nasal steroids, nasal saline irrigation. Patient to followup with her doctor as needed.  Luiz Blare, MD 07/10/11 2258

## 2011-07-10 NOTE — ED Notes (Signed)
Completed meds, still having issues w pain and pressure, nasal drainage

## 2012-03-18 ENCOUNTER — Other Ambulatory Visit: Payer: Self-pay | Admitting: Internal Medicine

## 2012-03-18 DIAGNOSIS — Z853 Personal history of malignant neoplasm of breast: Secondary | ICD-10-CM

## 2012-05-01 ENCOUNTER — Ambulatory Visit
Admission: RE | Admit: 2012-05-01 | Discharge: 2012-05-01 | Disposition: A | Discharge status: Home / Self Care | Payer: BC Managed Care – PPO | Source: Ambulatory Visit | Attending: Internal Medicine | Admitting: Internal Medicine

## 2012-05-01 DIAGNOSIS — Z853 Personal history of malignant neoplasm of breast: Secondary | ICD-10-CM

## 2012-07-17 ENCOUNTER — Ambulatory Visit
Admission: RE | Admit: 2012-07-17 | Discharge: 2012-07-17 | Disposition: A | Discharge status: Home / Self Care | Payer: BC Managed Care – PPO | Source: Ambulatory Visit | Attending: Family Medicine | Admitting: Family Medicine

## 2012-07-17 ENCOUNTER — Other Ambulatory Visit: Payer: Self-pay | Admitting: Family Medicine

## 2012-07-17 DIAGNOSIS — R1011 Right upper quadrant pain: Secondary | ICD-10-CM

## 2012-11-26 ENCOUNTER — Ambulatory Visit: Payer: Self-pay | Admitting: Neurology

## 2013-04-01 ENCOUNTER — Other Ambulatory Visit: Payer: Self-pay

## 2013-04-01 ENCOUNTER — Other Ambulatory Visit: Payer: Self-pay | Admitting: Internal Medicine

## 2013-04-01 DIAGNOSIS — Z853 Personal history of malignant neoplasm of breast: Secondary | ICD-10-CM

## 2013-11-30 ENCOUNTER — Encounter (INDEPENDENT_AMBULATORY_CARE_PROVIDER_SITE_OTHER): Payer: Self-pay

## 2013-11-30 ENCOUNTER — Ambulatory Visit
Admission: RE | Admit: 2013-11-30 | Discharge: 2013-11-30 | Disposition: A | Discharge status: Home / Self Care | Payer: BC Managed Care – PPO | Source: Ambulatory Visit | Attending: Internal Medicine | Admitting: Internal Medicine

## 2013-11-30 DIAGNOSIS — Z853 Personal history of malignant neoplasm of breast: Secondary | ICD-10-CM

## 2014-12-19 ENCOUNTER — Other Ambulatory Visit: Payer: Self-pay

## 2014-12-19 ENCOUNTER — Ambulatory Visit
Admission: RE | Admit: 2014-12-19 | Discharge: 2014-12-19 | Disposition: A | Discharge status: Home / Self Care | Payer: BLUE CROSS/BLUE SHIELD | Source: Ambulatory Visit

## 2014-12-19 DIAGNOSIS — Z1231 Encounter for screening mammogram for malignant neoplasm of breast: Secondary | ICD-10-CM

## 2015-11-23 ENCOUNTER — Other Ambulatory Visit: Payer: Self-pay | Admitting: Internal Medicine

## 2015-11-23 DIAGNOSIS — Z1231 Encounter for screening mammogram for malignant neoplasm of breast: Secondary | ICD-10-CM

## 2015-12-19 DIAGNOSIS — E784 Other hyperlipidemia: Secondary | ICD-10-CM | POA: Diagnosis not present

## 2015-12-19 DIAGNOSIS — E119 Type 2 diabetes mellitus without complications: Secondary | ICD-10-CM | POA: Diagnosis not present

## 2015-12-19 DIAGNOSIS — Z0000 Encounter for general adult medical examination without abnormal findings: Secondary | ICD-10-CM | POA: Diagnosis not present

## 2015-12-25 ENCOUNTER — Other Ambulatory Visit: Payer: Self-pay | Admitting: Internal Medicine

## 2015-12-25 ENCOUNTER — Ambulatory Visit
Admission: RE | Admit: 2015-12-25 | Discharge: 2015-12-25 | Disposition: A | Discharge status: Home / Self Care | Payer: BLUE CROSS/BLUE SHIELD | Source: Ambulatory Visit | Attending: Internal Medicine | Admitting: Internal Medicine

## 2015-12-25 DIAGNOSIS — Z1231 Encounter for screening mammogram for malignant neoplasm of breast: Secondary | ICD-10-CM

## 2015-12-29 DIAGNOSIS — J45909 Unspecified asthma, uncomplicated: Secondary | ICD-10-CM | POA: Diagnosis not present

## 2015-12-29 DIAGNOSIS — E784 Other hyperlipidemia: Secondary | ICD-10-CM | POA: Diagnosis not present

## 2015-12-29 DIAGNOSIS — Z1389 Encounter for screening for other disorder: Secondary | ICD-10-CM | POA: Diagnosis not present

## 2015-12-29 DIAGNOSIS — Z0000 Encounter for general adult medical examination without abnormal findings: Secondary | ICD-10-CM | POA: Diagnosis not present

## 2015-12-29 DIAGNOSIS — E119 Type 2 diabetes mellitus without complications: Secondary | ICD-10-CM | POA: Diagnosis not present

## 2015-12-29 DIAGNOSIS — I10 Essential (primary) hypertension: Secondary | ICD-10-CM | POA: Diagnosis not present

## 2016-03-18 DIAGNOSIS — R05 Cough: Secondary | ICD-10-CM | POA: Diagnosis not present

## 2016-03-18 DIAGNOSIS — J019 Acute sinusitis, unspecified: Secondary | ICD-10-CM | POA: Diagnosis not present

## 2016-03-18 DIAGNOSIS — Z6841 Body Mass Index (BMI) 40.0 and over, adult: Secondary | ICD-10-CM | POA: Diagnosis not present

## 2016-04-24 DIAGNOSIS — Z23 Encounter for immunization: Secondary | ICD-10-CM | POA: Diagnosis not present

## 2016-04-30 DIAGNOSIS — E119 Type 2 diabetes mellitus without complications: Secondary | ICD-10-CM | POA: Diagnosis not present

## 2016-04-30 DIAGNOSIS — I10 Essential (primary) hypertension: Secondary | ICD-10-CM | POA: Diagnosis not present

## 2016-04-30 DIAGNOSIS — J45909 Unspecified asthma, uncomplicated: Secondary | ICD-10-CM | POA: Diagnosis not present

## 2016-04-30 DIAGNOSIS — E784 Other hyperlipidemia: Secondary | ICD-10-CM | POA: Diagnosis not present

## 2016-08-30 DIAGNOSIS — J45909 Unspecified asthma, uncomplicated: Secondary | ICD-10-CM | POA: Diagnosis not present

## 2016-08-30 DIAGNOSIS — I10 Essential (primary) hypertension: Secondary | ICD-10-CM | POA: Diagnosis not present

## 2016-08-30 DIAGNOSIS — E119 Type 2 diabetes mellitus without complications: Secondary | ICD-10-CM | POA: Diagnosis not present

## 2016-08-30 DIAGNOSIS — E784 Other hyperlipidemia: Secondary | ICD-10-CM | POA: Diagnosis not present

## 2016-11-14 ENCOUNTER — Other Ambulatory Visit: Payer: Self-pay | Admitting: Internal Medicine

## 2016-11-14 DIAGNOSIS — Z853 Personal history of malignant neoplasm of breast: Secondary | ICD-10-CM

## 2016-11-14 DIAGNOSIS — Z1231 Encounter for screening mammogram for malignant neoplasm of breast: Secondary | ICD-10-CM

## 2016-12-26 ENCOUNTER — Ambulatory Visit
Admission: RE | Admit: 2016-12-26 | Discharge: 2016-12-26 | Disposition: A | Discharge status: Home / Self Care | Payer: BLUE CROSS/BLUE SHIELD | Source: Ambulatory Visit | Attending: Internal Medicine | Admitting: Internal Medicine

## 2016-12-26 DIAGNOSIS — Z1231 Encounter for screening mammogram for malignant neoplasm of breast: Secondary | ICD-10-CM | POA: Diagnosis not present

## 2016-12-26 DIAGNOSIS — E784 Other hyperlipidemia: Secondary | ICD-10-CM | POA: Diagnosis not present

## 2016-12-26 DIAGNOSIS — I10 Essential (primary) hypertension: Secondary | ICD-10-CM | POA: Diagnosis not present

## 2016-12-26 DIAGNOSIS — E119 Type 2 diabetes mellitus without complications: Secondary | ICD-10-CM | POA: Diagnosis not present

## 2016-12-26 DIAGNOSIS — Z853 Personal history of malignant neoplasm of breast: Secondary | ICD-10-CM

## 2017-01-01 DIAGNOSIS — E119 Type 2 diabetes mellitus without complications: Secondary | ICD-10-CM | POA: Diagnosis not present

## 2017-01-01 DIAGNOSIS — I10 Essential (primary) hypertension: Secondary | ICD-10-CM | POA: Diagnosis not present

## 2017-01-01 DIAGNOSIS — Z0000 Encounter for general adult medical examination without abnormal findings: Secondary | ICD-10-CM | POA: Diagnosis not present

## 2017-01-01 DIAGNOSIS — J45909 Unspecified asthma, uncomplicated: Secondary | ICD-10-CM | POA: Diagnosis not present

## 2017-01-01 DIAGNOSIS — E784 Other hyperlipidemia: Secondary | ICD-10-CM | POA: Diagnosis not present

## 2017-01-01 DIAGNOSIS — Z1389 Encounter for screening for other disorder: Secondary | ICD-10-CM | POA: Diagnosis not present

## 2017-07-18 DIAGNOSIS — E119 Type 2 diabetes mellitus without complications: Secondary | ICD-10-CM | POA: Diagnosis not present

## 2017-07-18 DIAGNOSIS — E7849 Other hyperlipidemia: Secondary | ICD-10-CM | POA: Diagnosis not present

## 2017-07-18 DIAGNOSIS — I10 Essential (primary) hypertension: Secondary | ICD-10-CM | POA: Diagnosis not present

## 2017-07-18 DIAGNOSIS — J45909 Unspecified asthma, uncomplicated: Secondary | ICD-10-CM | POA: Diagnosis not present

## 2017-09-22 DIAGNOSIS — M545 Low back pain: Secondary | ICD-10-CM | POA: Diagnosis not present

## 2017-12-15 DIAGNOSIS — M545 Low back pain: Secondary | ICD-10-CM | POA: Diagnosis not present

## 2018-01-20 DIAGNOSIS — I10 Essential (primary) hypertension: Secondary | ICD-10-CM | POA: Diagnosis not present

## 2018-01-20 DIAGNOSIS — E119 Type 2 diabetes mellitus without complications: Secondary | ICD-10-CM | POA: Diagnosis not present

## 2018-01-20 DIAGNOSIS — E7849 Other hyperlipidemia: Secondary | ICD-10-CM | POA: Diagnosis not present

## 2018-01-20 DIAGNOSIS — R82998 Other abnormal findings in urine: Secondary | ICD-10-CM | POA: Diagnosis not present

## 2018-01-27 DIAGNOSIS — E119 Type 2 diabetes mellitus without complications: Secondary | ICD-10-CM | POA: Diagnosis not present

## 2018-01-27 DIAGNOSIS — J45909 Unspecified asthma, uncomplicated: Secondary | ICD-10-CM | POA: Diagnosis not present

## 2018-01-27 DIAGNOSIS — Z1389 Encounter for screening for other disorder: Secondary | ICD-10-CM | POA: Diagnosis not present

## 2018-01-27 DIAGNOSIS — I10 Essential (primary) hypertension: Secondary | ICD-10-CM | POA: Diagnosis not present

## 2018-01-27 DIAGNOSIS — Z23 Encounter for immunization: Secondary | ICD-10-CM | POA: Diagnosis not present

## 2018-01-27 DIAGNOSIS — E7849 Other hyperlipidemia: Secondary | ICD-10-CM | POA: Diagnosis not present

## 2018-01-27 DIAGNOSIS — Z0000 Encounter for general adult medical examination without abnormal findings: Secondary | ICD-10-CM | POA: Diagnosis not present

## 2018-02-15 DIAGNOSIS — M545 Low back pain: Secondary | ICD-10-CM | POA: Diagnosis not present

## 2018-03-17 DIAGNOSIS — M5489 Other dorsalgia: Secondary | ICD-10-CM | POA: Diagnosis not present

## 2018-03-17 DIAGNOSIS — M898X1 Other specified disorders of bone, shoulder: Secondary | ICD-10-CM | POA: Diagnosis not present

## 2018-03-17 DIAGNOSIS — Z6839 Body mass index (BMI) 39.0-39.9, adult: Secondary | ICD-10-CM | POA: Diagnosis not present

## 2018-04-11 DIAGNOSIS — Z23 Encounter for immunization: Secondary | ICD-10-CM | POA: Diagnosis not present

## 2018-06-07 DIAGNOSIS — M5030 Other cervical disc degeneration, unspecified cervical region: Secondary | ICD-10-CM | POA: Diagnosis not present

## 2018-06-07 DIAGNOSIS — M25511 Pain in right shoulder: Secondary | ICD-10-CM | POA: Diagnosis not present

## 2018-06-10 ENCOUNTER — Other Ambulatory Visit: Payer: Self-pay | Admitting: Internal Medicine

## 2018-06-10 DIAGNOSIS — M5412 Radiculopathy, cervical region: Secondary | ICD-10-CM

## 2018-06-10 DIAGNOSIS — Z6838 Body mass index (BMI) 38.0-38.9, adult: Secondary | ICD-10-CM | POA: Diagnosis not present

## 2018-06-10 DIAGNOSIS — M898X1 Other specified disorders of bone, shoulder: Secondary | ICD-10-CM | POA: Diagnosis not present

## 2018-06-23 ENCOUNTER — Other Ambulatory Visit: Payer: BLUE CROSS/BLUE SHIELD

## 2018-06-25 ENCOUNTER — Other Ambulatory Visit: Payer: Self-pay | Admitting: Internal Medicine

## 2018-06-26 ENCOUNTER — Ambulatory Visit
Admission: RE | Admit: 2018-06-26 | Discharge: 2018-06-26 | Disposition: A | Discharge status: Home / Self Care | Payer: BLUE CROSS/BLUE SHIELD | Source: Ambulatory Visit | Attending: Internal Medicine | Admitting: Internal Medicine

## 2018-06-26 DIAGNOSIS — M5412 Radiculopathy, cervical region: Secondary | ICD-10-CM

## 2018-06-26 DIAGNOSIS — M4802 Spinal stenosis, cervical region: Secondary | ICD-10-CM | POA: Diagnosis not present

## 2018-07-01 DIAGNOSIS — M5412 Radiculopathy, cervical region: Secondary | ICD-10-CM | POA: Diagnosis not present

## 2018-07-10 DIAGNOSIS — E119 Type 2 diabetes mellitus without complications: Secondary | ICD-10-CM | POA: Diagnosis not present

## 2018-07-10 DIAGNOSIS — M5412 Radiculopathy, cervical region: Secondary | ICD-10-CM | POA: Diagnosis not present

## 2018-07-10 DIAGNOSIS — M5020 Other cervical disc displacement, unspecified cervical region: Secondary | ICD-10-CM | POA: Diagnosis not present

## 2018-07-10 DIAGNOSIS — M4802 Spinal stenosis, cervical region: Secondary | ICD-10-CM | POA: Diagnosis not present

## 2018-07-15 DIAGNOSIS — Z6837 Body mass index (BMI) 37.0-37.9, adult: Secondary | ICD-10-CM | POA: Diagnosis not present

## 2018-07-15 DIAGNOSIS — I10 Essential (primary) hypertension: Secondary | ICD-10-CM | POA: Diagnosis not present

## 2018-07-15 DIAGNOSIS — M4802 Spinal stenosis, cervical region: Secondary | ICD-10-CM | POA: Diagnosis not present

## 2018-07-17 DIAGNOSIS — G5602 Carpal tunnel syndrome, left upper limb: Secondary | ICD-10-CM | POA: Diagnosis not present

## 2018-07-17 DIAGNOSIS — G5620 Lesion of ulnar nerve, unspecified upper limb: Secondary | ICD-10-CM | POA: Diagnosis not present

## 2018-07-21 DIAGNOSIS — R293 Abnormal posture: Secondary | ICD-10-CM | POA: Diagnosis not present

## 2018-07-21 DIAGNOSIS — M542 Cervicalgia: Secondary | ICD-10-CM | POA: Diagnosis not present

## 2018-07-21 DIAGNOSIS — M629 Disorder of muscle, unspecified: Secondary | ICD-10-CM | POA: Diagnosis not present

## 2018-07-21 DIAGNOSIS — M5412 Radiculopathy, cervical region: Secondary | ICD-10-CM | POA: Diagnosis not present

## 2018-07-22 DIAGNOSIS — M629 Disorder of muscle, unspecified: Secondary | ICD-10-CM | POA: Diagnosis not present

## 2018-07-22 DIAGNOSIS — M5412 Radiculopathy, cervical region: Secondary | ICD-10-CM | POA: Diagnosis not present

## 2018-07-22 DIAGNOSIS — M542 Cervicalgia: Secondary | ICD-10-CM | POA: Diagnosis not present

## 2018-07-22 DIAGNOSIS — R293 Abnormal posture: Secondary | ICD-10-CM | POA: Diagnosis not present

## 2018-07-27 DIAGNOSIS — J45909 Unspecified asthma, uncomplicated: Secondary | ICD-10-CM | POA: Diagnosis not present

## 2018-07-27 DIAGNOSIS — E7849 Other hyperlipidemia: Secondary | ICD-10-CM | POA: Diagnosis not present

## 2018-07-27 DIAGNOSIS — I10 Essential (primary) hypertension: Secondary | ICD-10-CM | POA: Diagnosis not present

## 2018-07-27 DIAGNOSIS — E119 Type 2 diabetes mellitus without complications: Secondary | ICD-10-CM | POA: Diagnosis not present

## 2018-07-28 DIAGNOSIS — R293 Abnormal posture: Secondary | ICD-10-CM | POA: Diagnosis not present

## 2018-07-28 DIAGNOSIS — M542 Cervicalgia: Secondary | ICD-10-CM | POA: Diagnosis not present

## 2018-07-28 DIAGNOSIS — M629 Disorder of muscle, unspecified: Secondary | ICD-10-CM | POA: Diagnosis not present

## 2018-07-28 DIAGNOSIS — M5412 Radiculopathy, cervical region: Secondary | ICD-10-CM | POA: Diagnosis not present

## 2018-07-30 DIAGNOSIS — M5412 Radiculopathy, cervical region: Secondary | ICD-10-CM | POA: Diagnosis not present

## 2018-07-30 DIAGNOSIS — M629 Disorder of muscle, unspecified: Secondary | ICD-10-CM | POA: Diagnosis not present

## 2018-07-30 DIAGNOSIS — R293 Abnormal posture: Secondary | ICD-10-CM | POA: Diagnosis not present

## 2018-07-30 DIAGNOSIS — M542 Cervicalgia: Secondary | ICD-10-CM | POA: Diagnosis not present

## 2018-08-04 DIAGNOSIS — M542 Cervicalgia: Secondary | ICD-10-CM | POA: Diagnosis not present

## 2018-08-04 DIAGNOSIS — M5412 Radiculopathy, cervical region: Secondary | ICD-10-CM | POA: Diagnosis not present

## 2018-08-04 DIAGNOSIS — R293 Abnormal posture: Secondary | ICD-10-CM | POA: Diagnosis not present

## 2018-08-04 DIAGNOSIS — M629 Disorder of muscle, unspecified: Secondary | ICD-10-CM | POA: Diagnosis not present

## 2018-08-06 DIAGNOSIS — M5412 Radiculopathy, cervical region: Secondary | ICD-10-CM | POA: Diagnosis not present

## 2018-08-06 DIAGNOSIS — M542 Cervicalgia: Secondary | ICD-10-CM | POA: Diagnosis not present

## 2018-08-06 DIAGNOSIS — M629 Disorder of muscle, unspecified: Secondary | ICD-10-CM | POA: Diagnosis not present

## 2018-08-06 DIAGNOSIS — R293 Abnormal posture: Secondary | ICD-10-CM | POA: Diagnosis not present

## 2018-08-11 DIAGNOSIS — M542 Cervicalgia: Secondary | ICD-10-CM | POA: Diagnosis not present

## 2018-08-11 DIAGNOSIS — R293 Abnormal posture: Secondary | ICD-10-CM | POA: Diagnosis not present

## 2018-08-11 DIAGNOSIS — M629 Disorder of muscle, unspecified: Secondary | ICD-10-CM | POA: Diagnosis not present

## 2018-08-11 DIAGNOSIS — M5412 Radiculopathy, cervical region: Secondary | ICD-10-CM | POA: Diagnosis not present

## 2018-08-20 DIAGNOSIS — M542 Cervicalgia: Secondary | ICD-10-CM | POA: Diagnosis not present

## 2018-08-20 DIAGNOSIS — M5412 Radiculopathy, cervical region: Secondary | ICD-10-CM | POA: Diagnosis not present

## 2018-08-20 DIAGNOSIS — R293 Abnormal posture: Secondary | ICD-10-CM | POA: Diagnosis not present

## 2018-08-20 DIAGNOSIS — M629 Disorder of muscle, unspecified: Secondary | ICD-10-CM | POA: Diagnosis not present

## 2019-01-15 ENCOUNTER — Telehealth: Payer: Self-pay | Admitting: Family Medicine

## 2019-01-18 NOTE — Telephone Encounter (Signed)
Sent to nurse

## 2019-02-04 DIAGNOSIS — E7849 Other hyperlipidemia: Secondary | ICD-10-CM | POA: Diagnosis not present

## 2019-02-04 DIAGNOSIS — E119 Type 2 diabetes mellitus without complications: Secondary | ICD-10-CM | POA: Diagnosis not present

## 2019-02-04 DIAGNOSIS — Z0000 Encounter for general adult medical examination without abnormal findings: Secondary | ICD-10-CM | POA: Diagnosis not present

## 2019-02-05 DIAGNOSIS — R82998 Other abnormal findings in urine: Secondary | ICD-10-CM | POA: Diagnosis not present

## 2019-02-05 DIAGNOSIS — I10 Essential (primary) hypertension: Secondary | ICD-10-CM | POA: Diagnosis not present

## 2019-02-11 DIAGNOSIS — E785 Hyperlipidemia, unspecified: Secondary | ICD-10-CM | POA: Diagnosis not present

## 2019-02-11 DIAGNOSIS — I10 Essential (primary) hypertension: Secondary | ICD-10-CM | POA: Diagnosis not present

## 2019-02-11 DIAGNOSIS — E119 Type 2 diabetes mellitus without complications: Secondary | ICD-10-CM | POA: Diagnosis not present

## 2019-02-11 DIAGNOSIS — Z0000 Encounter for general adult medical examination without abnormal findings: Secondary | ICD-10-CM | POA: Diagnosis not present

## 2019-02-11 DIAGNOSIS — Z1331 Encounter for screening for depression: Secondary | ICD-10-CM | POA: Diagnosis not present

## 2019-02-11 DIAGNOSIS — J45909 Unspecified asthma, uncomplicated: Secondary | ICD-10-CM | POA: Diagnosis not present

## 2019-02-12 ENCOUNTER — Telehealth: Payer: Self-pay | Admitting: Internal Medicine

## 2019-02-12 NOTE — Telephone Encounter (Signed)
Dr. Carlean Purl, pt is referred to you for a colonoscopy: hx of colonic polyps.  Previous GI records from [place] will be sent to you for review.  Please advise scheduling.

## 2019-02-17 ENCOUNTER — Encounter: Payer: Self-pay | Admitting: Internal Medicine

## 2019-02-17 ENCOUNTER — Telehealth: Payer: Self-pay | Admitting: Internal Medicine

## 2019-02-17 NOTE — Telephone Encounter (Signed)
Patient declined to schedule at this time due to financial reasons--requested to schedule in early 2021.    Pt reported that during 2008 colon she experienced AFib and was hospitalised.

## 2019-02-17 NOTE — Telephone Encounter (Signed)
Chart/records reviewed OK to schedule direct colonoscopy  Hx adenoma 2008 No polyps 2015 - fair prep  Repeat 5 yrs recommended with extra prep needed  Please call patient and set up previsit and colonoscopy

## 2019-02-24 ENCOUNTER — Telehealth: Payer: Self-pay | Admitting: Internal Medicine

## 2019-02-24 DIAGNOSIS — Z23 Encounter for immunization: Secondary | ICD-10-CM | POA: Diagnosis not present

## 2019-02-24 NOTE — Telephone Encounter (Signed)
[name] has a history of colon polyp, in 2008 she had a diminutive adenoma removed with biopsy forceps, 0.2 -.3 cm pieces x2 on the pathology.  Dr. Michail Sermon did this and then brought her to the hospital for removal of the larger lesion, that turned out to be a lipoma so it looks to me like she had a diminutive or very small subcentimeter adenoma sitting on top of the lipoma.  She had to be admitted because of A. fib after that.  Subsequent colonoscopy was in 2015 by Dr. Carlton Adam at [facility] and was negative but was a fair prep.  Repeat was recommended in 5 years.   She has done Hemoccults over the years with Dr. Brigitte Pulse and reportedly those were negative though she did not get that done this year due to the pandemic and telehealth.  She is not having any active symptoms.   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  I told her that it does not seem like she is a great increased risk of polyps or colon cancer based upon her overall history though I would be more assured if she had had a prep that was at least adequate in 2015.  However if she has done annual Hemoccults that may be enough in her situation.  Assuming they were negative.  It is really up to her what to do but she is concerned about the cost.  I will check to see what her liability would be and let her know and we will figure out from there.

## 2019-03-03 ENCOUNTER — Other Ambulatory Visit: Payer: Self-pay | Admitting: Internal Medicine

## 2019-03-03 DIAGNOSIS — Z1231 Encounter for screening mammogram for malignant neoplasm of breast: Secondary | ICD-10-CM

## 2019-03-04 NOTE — Telephone Encounter (Signed)
She has decided to do annual hemoccults for now.  We reviewed her deductible and liability for costs  I think that is reasonable - she understands not as good as colonoscopy but given high deductible has chosen that approach.

## 2019-03-08 ENCOUNTER — Ambulatory Visit: Payer: BC Managed Care – PPO

## 2019-03-11 DIAGNOSIS — Z1212 Encounter for screening for malignant neoplasm of rectum: Secondary | ICD-10-CM | POA: Diagnosis not present

## 2019-04-21 ENCOUNTER — Ambulatory Visit
Admission: RE | Admit: 2019-04-21 | Discharge: 2019-04-21 | Disposition: A | Discharge status: Home / Self Care | Payer: BC Managed Care – PPO | Source: Ambulatory Visit | Attending: Internal Medicine | Admitting: Internal Medicine

## 2019-04-21 ENCOUNTER — Other Ambulatory Visit: Payer: Self-pay

## 2019-04-21 DIAGNOSIS — Z1231 Encounter for screening mammogram for malignant neoplasm of breast: Secondary | ICD-10-CM

## 2019-05-12 DIAGNOSIS — R197 Diarrhea, unspecified: Secondary | ICD-10-CM | POA: Diagnosis not present

## 2019-05-12 DIAGNOSIS — R110 Nausea: Secondary | ICD-10-CM | POA: Diagnosis not present

## 2019-05-12 DIAGNOSIS — Z20818 Contact with and (suspected) exposure to other bacterial communicable diseases: Secondary | ICD-10-CM | POA: Diagnosis not present

## 2019-05-12 DIAGNOSIS — B349 Viral infection, unspecified: Secondary | ICD-10-CM | POA: Diagnosis not present

## 2019-08-08 ENCOUNTER — Ambulatory Visit: Payer: BC Managed Care – PPO | Attending: Internal Medicine

## 2019-08-08 DIAGNOSIS — Z23 Encounter for immunization: Secondary | ICD-10-CM | POA: Insufficient documentation

## 2019-08-08 NOTE — Progress Notes (Signed)
   Covid-19 Vaccination Clinic  Name:  Nellene Courtois    MRN: [id number] DOB: [birth year]  08/08/2019  Ms. Brunsman was observed post Covid-19 immunization for 15 minutes without incidence. She was provided with Vaccine Information Sheet and instruction to access the V-Safe system.   Ms. Berens was instructed to call 911 with any severe reactions post vaccine: Marland Kitchen Difficulty breathing  . Swelling of your face and throat  . A fast heartbeat  . A bad rash all over your body  . Dizziness and weakness    Immunizations Administered    Name Date Dose VIS Date Route   Pfizer COVID-19 Vaccine 08/08/2019  8:22 AM 0.3 mL 06/04/2019 Intramuscular   Manufacturer: [employer], [employer]   Lot: [id number]   NDC: [address]-1

## 2019-08-10 DIAGNOSIS — H25811 Combined forms of age-related cataract, right eye: Secondary | ICD-10-CM | POA: Diagnosis not present

## 2019-08-10 DIAGNOSIS — H2511 Age-related nuclear cataract, right eye: Secondary | ICD-10-CM | POA: Diagnosis not present

## 2019-08-24 DIAGNOSIS — H2512 Age-related nuclear cataract, left eye: Secondary | ICD-10-CM | POA: Diagnosis not present

## 2019-08-24 DIAGNOSIS — H25812 Combined forms of age-related cataract, left eye: Secondary | ICD-10-CM | POA: Diagnosis not present

## 2019-08-30 ENCOUNTER — Ambulatory Visit: Payer: BC Managed Care – PPO | Attending: Internal Medicine

## 2019-08-30 DIAGNOSIS — Z23 Encounter for immunization: Secondary | ICD-10-CM | POA: Insufficient documentation

## 2019-08-30 NOTE — Progress Notes (Signed)
   Covid-19 Vaccination Clinic  Name:  [name]    MRN: [id number] DOB: 1951-02-20  08/30/2019  [name] was observed post Covid-19 immunization for 15 minutes without incident. She was provided with Vaccine Information Sheet and instruction to access the V-Safe system.   [name] was instructed to call 911 with any severe reactions post vaccine: Marland Kitchen Difficulty breathing  . Swelling of face and throat  . A fast heartbeat  . A bad rash all over body  . Dizziness and weakness   Immunizations Administered    Name Date Dose VIS Date Route   Pfizer COVID-19 Vaccine 08/30/2019  4:31 PM 0.3 mL 06/04/2019 Intramuscular   Manufacturer: [employer], [employer]   Lot: [id number]   NDC: [address]-1

## 2019-09-09 DIAGNOSIS — M8589 Other specified disorders of bone density and structure, multiple sites: Secondary | ICD-10-CM | POA: Diagnosis not present

## 2019-09-09 DIAGNOSIS — I10 Essential (primary) hypertension: Secondary | ICD-10-CM | POA: Diagnosis not present

## 2019-09-09 DIAGNOSIS — J45909 Unspecified asthma, uncomplicated: Secondary | ICD-10-CM | POA: Diagnosis not present

## 2019-09-09 DIAGNOSIS — E785 Hyperlipidemia, unspecified: Secondary | ICD-10-CM | POA: Diagnosis not present

## 2019-09-09 DIAGNOSIS — E119 Type 2 diabetes mellitus without complications: Secondary | ICD-10-CM | POA: Diagnosis not present

## 2019-11-27 IMAGING — MG DIGITAL SCREENING BILAT W/ TOMO
8 series · 8 of 24 positions shown · non-contrast
Comparison: Previous exam(s).

CLINICAL DATA: Screening.

EXAM:
DIGITAL SCREENING BILATERAL MAMMOGRAM WITH TOMO AND CAD

[R CC synth-2D]
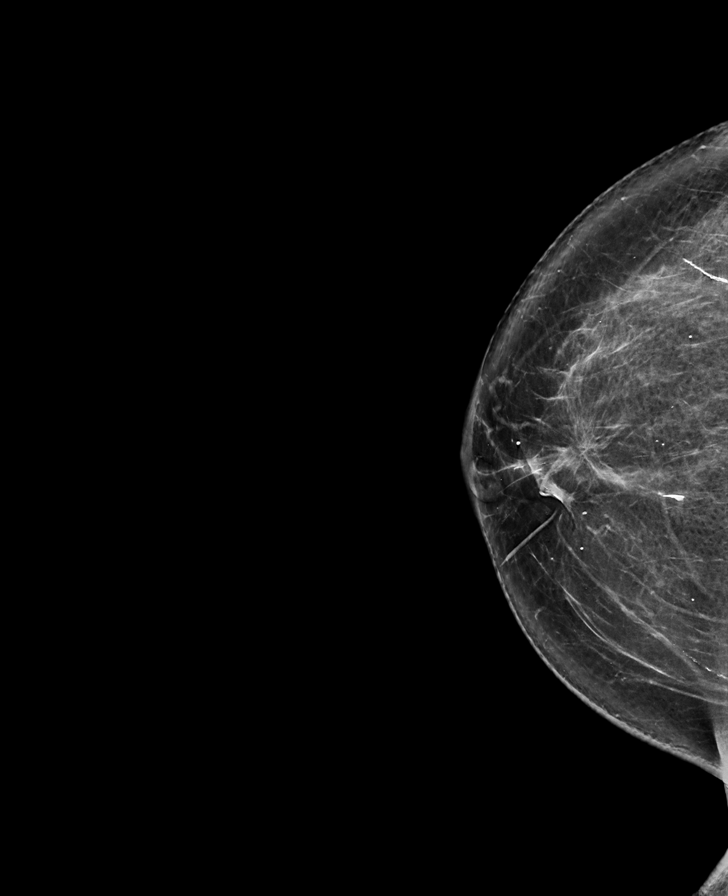

[L CC synth-2D]
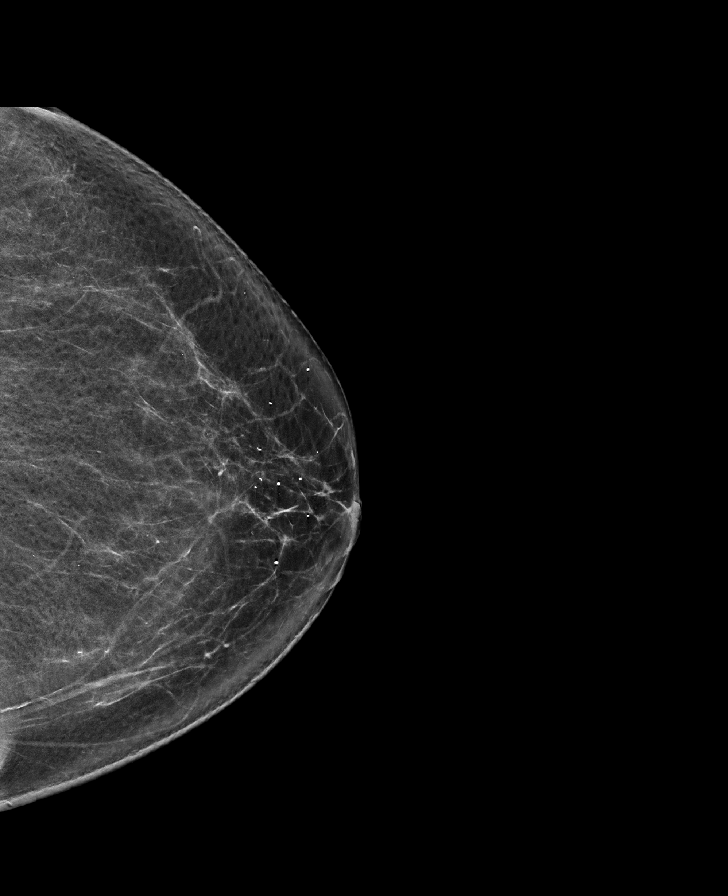

[L MLO synth-2D]
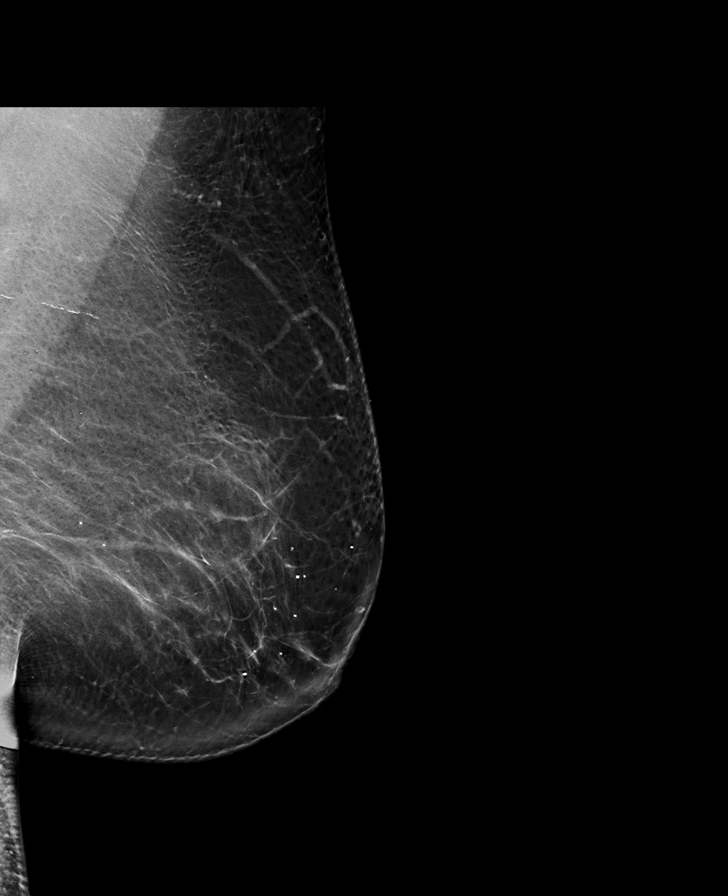

[R MLO synth-2D]
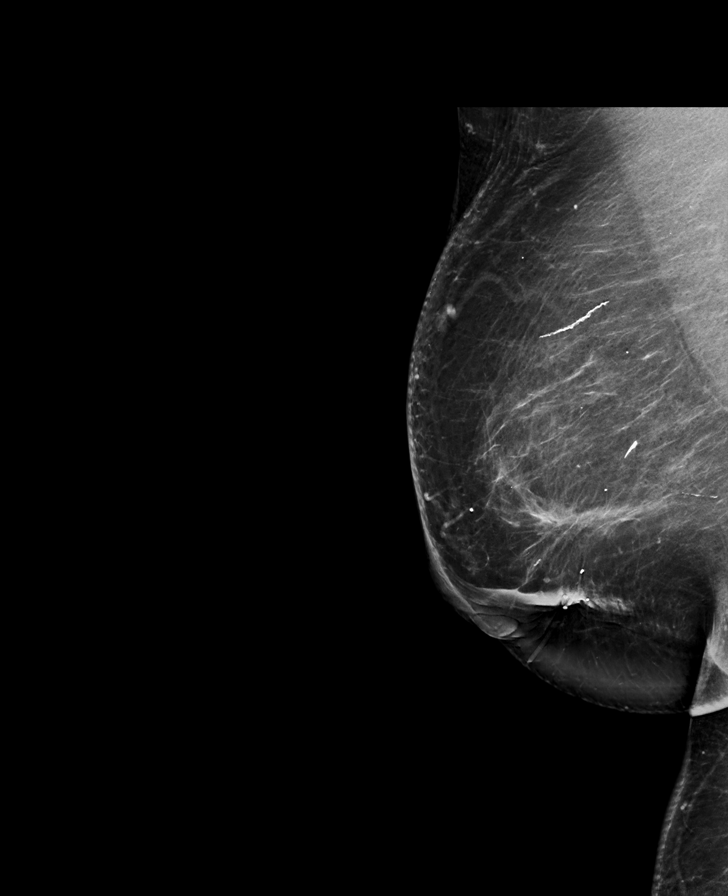

[L CC tomo · tomo slice 39/76.0]
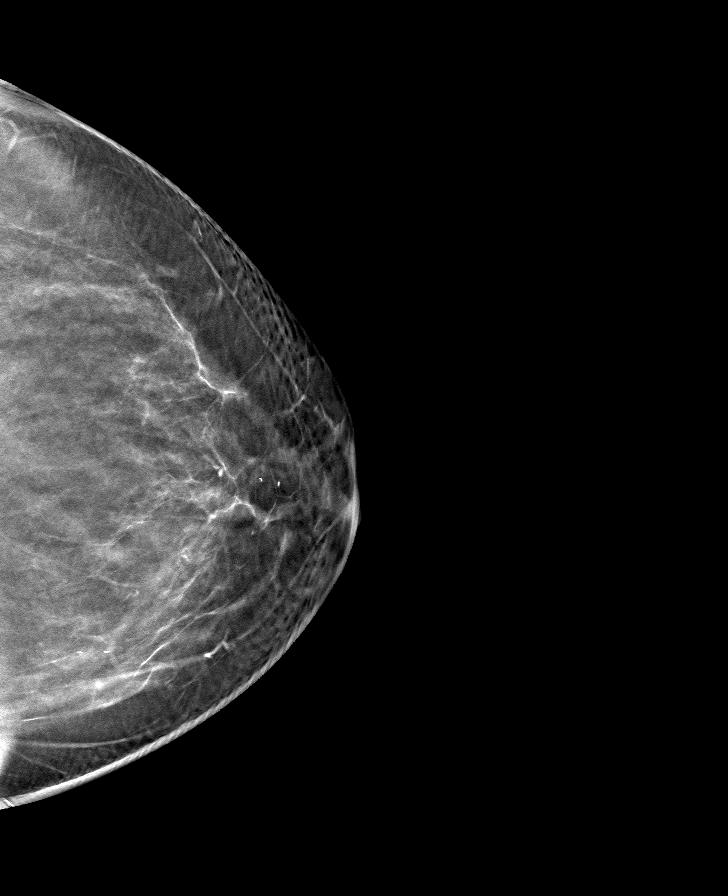

[R MLO tomo · tomo slice 47/92.0]
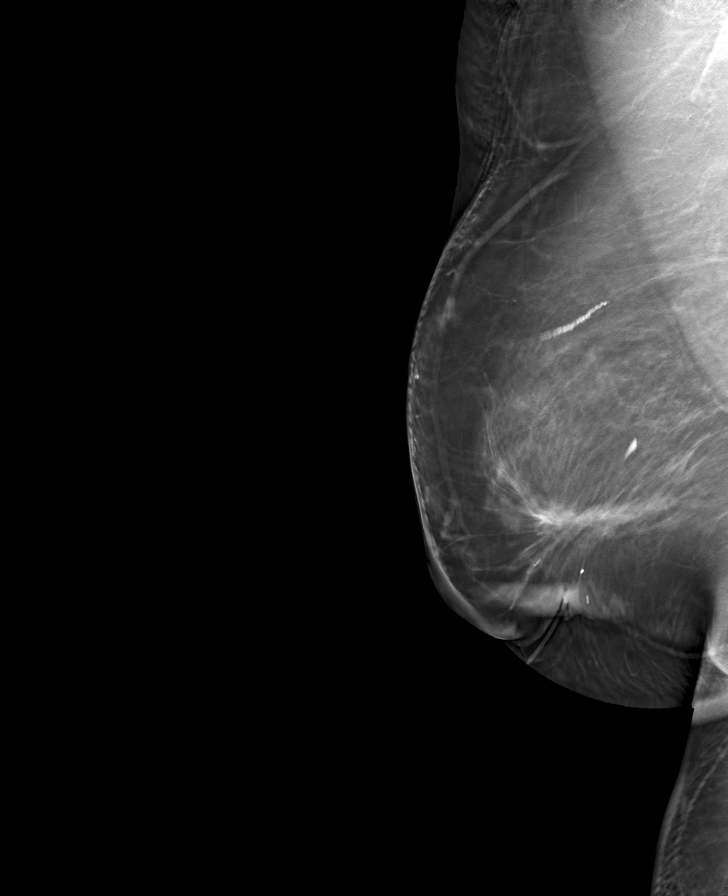

[R CC tomo · tomo slice 39/78.0]
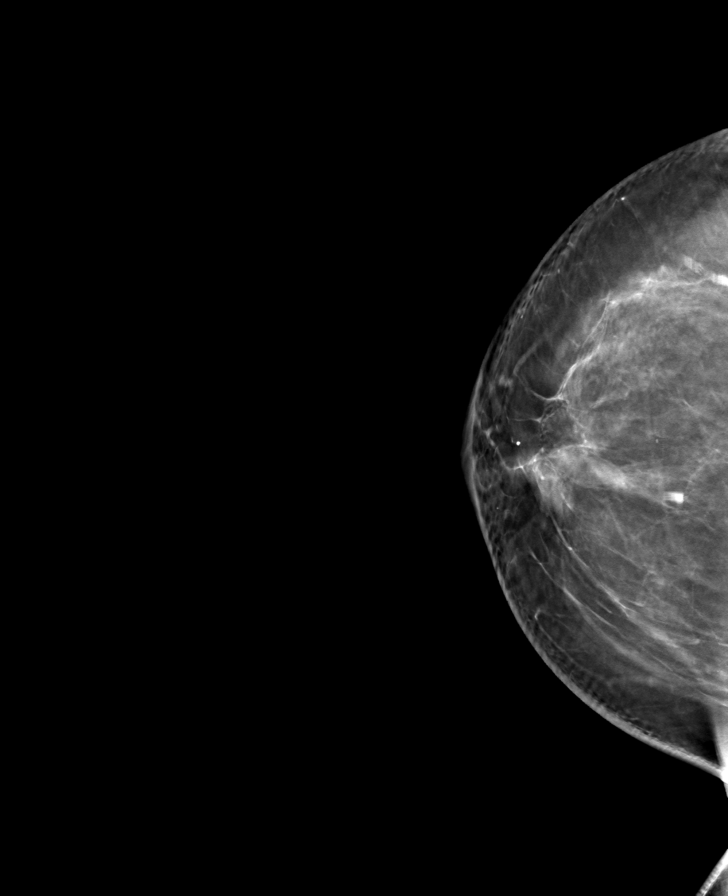

[L MLO tomo · tomo slice 45/88.0]
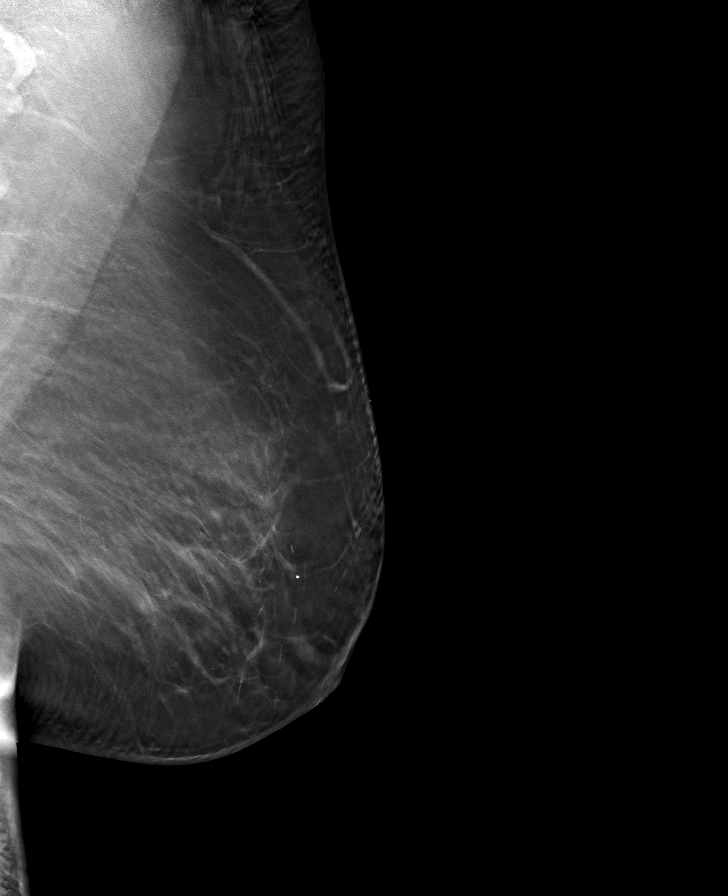

[8 of 24 positions shown; findings below may reference images not displayed]

ACR Breast Density Category b: There are scattered areas of
fibroglandular density.
FINDINGS: There are no findings suspicious for malignancy. Images were
processed with CAD.
IMPRESSION: No mammographic evidence of malignancy. A result letter of this
screening mammogram will be mailed directly to the patient.

RECOMMENDATION:
Screening mammogram in one year. (Code:[id number])

BI-RADS CATEGORY  1: Negative.

## 2019-11-28 DIAGNOSIS — J029 Acute pharyngitis, unspecified: Secondary | ICD-10-CM | POA: Diagnosis not present

## 2020-03-10 DIAGNOSIS — E119 Type 2 diabetes mellitus without complications: Secondary | ICD-10-CM | POA: Diagnosis not present

## 2020-03-10 DIAGNOSIS — Z0000 Encounter for general adult medical examination without abnormal findings: Secondary | ICD-10-CM | POA: Diagnosis not present

## 2020-03-10 DIAGNOSIS — E785 Hyperlipidemia, unspecified: Secondary | ICD-10-CM | POA: Diagnosis not present

## 2020-03-10 DIAGNOSIS — M859 Disorder of bone density and structure, unspecified: Secondary | ICD-10-CM | POA: Diagnosis not present

## 2020-03-17 DIAGNOSIS — I10 Essential (primary) hypertension: Secondary | ICD-10-CM | POA: Diagnosis not present

## 2020-03-17 DIAGNOSIS — Z1331 Encounter for screening for depression: Secondary | ICD-10-CM | POA: Diagnosis not present

## 2020-03-17 DIAGNOSIS — R82998 Other abnormal findings in urine: Secondary | ICD-10-CM | POA: Diagnosis not present

## 2020-03-17 DIAGNOSIS — E119 Type 2 diabetes mellitus without complications: Secondary | ICD-10-CM | POA: Diagnosis not present

## 2020-03-17 DIAGNOSIS — Z0000 Encounter for general adult medical examination without abnormal findings: Secondary | ICD-10-CM | POA: Diagnosis not present

## 2020-03-17 DIAGNOSIS — Z23 Encounter for immunization: Secondary | ICD-10-CM | POA: Diagnosis not present

## 2020-03-21 DIAGNOSIS — Z1212 Encounter for screening for malignant neoplasm of rectum: Secondary | ICD-10-CM | POA: Diagnosis not present

## 2020-04-12 DIAGNOSIS — I10 Essential (primary) hypertension: Secondary | ICD-10-CM | POA: Diagnosis not present

## 2020-04-12 DIAGNOSIS — J4520 Mild intermittent asthma, uncomplicated: Secondary | ICD-10-CM | POA: Diagnosis not present

## 2020-04-12 DIAGNOSIS — E1169 Type 2 diabetes mellitus with other specified complication: Secondary | ICD-10-CM | POA: Diagnosis not present

## 2020-04-12 DIAGNOSIS — M4805 Spinal stenosis, thoracolumbar region: Secondary | ICD-10-CM | POA: Diagnosis not present

## 2020-06-01 DIAGNOSIS — B351 Tinea unguium: Secondary | ICD-10-CM | POA: Diagnosis not present

## 2020-06-22 DIAGNOSIS — B351 Tinea unguium: Secondary | ICD-10-CM | POA: Diagnosis not present
